# Patient Record
Sex: Female | Born: 1965 | Race: White | Hispanic: No | Smoking: Never smoker
Health system: Southern US, Community
[De-identification: ages and names within clinical notes are randomized; demographics above are authoritative.]

## PROBLEM LIST (undated history)

## (undated) DIAGNOSIS — Z9889 Other specified postprocedural states: Secondary | ICD-10-CM

## (undated) DIAGNOSIS — R011 Cardiac murmur, unspecified: Secondary | ICD-10-CM

## (undated) DIAGNOSIS — I10 Essential (primary) hypertension: Secondary | ICD-10-CM

## (undated) DIAGNOSIS — R112 Nausea with vomiting, unspecified: Secondary | ICD-10-CM

## (undated) DIAGNOSIS — A64 Unspecified sexually transmitted disease: Secondary | ICD-10-CM

## (undated) DIAGNOSIS — IMO0002 Reserved for concepts with insufficient information to code with codable children: Secondary | ICD-10-CM

## (undated) DIAGNOSIS — F32A Depression, unspecified: Secondary | ICD-10-CM

## (undated) DIAGNOSIS — K635 Polyp of colon: Secondary | ICD-10-CM

## (undated) DIAGNOSIS — N979 Female infertility, unspecified: Secondary | ICD-10-CM

## (undated) DIAGNOSIS — F419 Anxiety disorder, unspecified: Secondary | ICD-10-CM

## (undated) DIAGNOSIS — F329 Major depressive disorder, single episode, unspecified: Secondary | ICD-10-CM

## (undated) DIAGNOSIS — R87619 Unspecified abnormal cytological findings in specimens from cervix uteri: Secondary | ICD-10-CM

## (undated) DIAGNOSIS — J302 Other seasonal allergic rhinitis: Secondary | ICD-10-CM

## (undated) HISTORY — DX: Polyp of colon: K63.5

## (undated) HISTORY — DX: Other seasonal allergic rhinitis: J30.2

## (undated) HISTORY — DX: Depression, unspecified: F32.A

## (undated) HISTORY — DX: Anxiety disorder, unspecified: F41.9

## (undated) HISTORY — DX: Essential (primary) hypertension: I10

## (undated) HISTORY — PX: CRYOTHERAPY: SHX1416

## (undated) HISTORY — DX: Reserved for concepts with insufficient information to code with codable children: IMO0002

## (undated) HISTORY — DX: Unspecified sexually transmitted disease: A64

## (undated) HISTORY — PX: COLONOSCOPY: SHX174

## (undated) HISTORY — PX: COLPOSCOPY: SHX161

## (undated) HISTORY — DX: Cardiac murmur, unspecified: R01.1

## (undated) HISTORY — DX: Major depressive disorder, single episode, unspecified: F32.9

## (undated) HISTORY — DX: Unspecified abnormal cytological findings in specimens from cervix uteri: R87.619

## (undated) HISTORY — DX: Female infertility, unspecified: N97.9

## (undated) HISTORY — PX: DERMOID CYST  EXCISION: SHX1452

---

## 1990-02-02 HISTORY — PX: MANDIBLE SURGERY: SHX707

## 1998-05-15 ENCOUNTER — Other Ambulatory Visit: Admission: RE | Admit: 1998-05-15 | Discharge: 1998-05-15 | Payer: Self-pay | Admitting: *Deleted

## 1999-06-03 ENCOUNTER — Other Ambulatory Visit: Admission: RE | Admit: 1999-06-03 | Discharge: 1999-06-03 | Payer: Self-pay | Admitting: *Deleted

## 2001-03-08 ENCOUNTER — Other Ambulatory Visit: Admission: RE | Admit: 2001-03-08 | Discharge: 2001-03-08 | Payer: Self-pay | Admitting: *Deleted

## 2002-09-19 ENCOUNTER — Other Ambulatory Visit: Admission: RE | Admit: 2002-09-19 | Discharge: 2002-09-19 | Payer: Self-pay | Admitting: *Deleted

## 2003-11-26 ENCOUNTER — Other Ambulatory Visit: Admission: RE | Admit: 2003-11-26 | Discharge: 2003-11-26 | Payer: Self-pay | Admitting: *Deleted

## 2006-01-18 ENCOUNTER — Other Ambulatory Visit: Admission: RE | Admit: 2006-01-18 | Discharge: 2006-01-18 | Payer: Self-pay | Admitting: *Deleted

## 2006-01-22 ENCOUNTER — Ambulatory Visit: Payer: Self-pay | Admitting: Gastroenterology

## 2006-02-05 ENCOUNTER — Encounter (INDEPENDENT_AMBULATORY_CARE_PROVIDER_SITE_OTHER): Payer: Self-pay | Admitting: Specialist

## 2006-02-05 ENCOUNTER — Ambulatory Visit: Payer: Self-pay | Admitting: Gastroenterology

## 2007-04-25 ENCOUNTER — Other Ambulatory Visit: Admission: RE | Admit: 2007-04-25 | Discharge: 2007-04-25 | Payer: Self-pay | Admitting: *Deleted

## 2008-01-16 ENCOUNTER — Other Ambulatory Visit: Admission: RE | Admit: 2008-01-16 | Discharge: 2008-01-16 | Payer: Self-pay | Admitting: Obstetrics and Gynecology

## 2008-02-03 HISTORY — PX: ECTOPIC PREGNANCY SURGERY: SHX613

## 2008-09-06 ENCOUNTER — Ambulatory Visit (HOSPITAL_COMMUNITY): Admission: AD | Admit: 2008-09-06 | Discharge: 2008-09-06 | Payer: Self-pay | Admitting: Obstetrics and Gynecology

## 2008-09-09 ENCOUNTER — Inpatient Hospital Stay (HOSPITAL_COMMUNITY): Admission: AD | Admit: 2008-09-09 | Discharge: 2008-09-09 | Payer: Self-pay | Admitting: Obstetrics

## 2008-09-12 ENCOUNTER — Ambulatory Visit (HOSPITAL_COMMUNITY): Admission: RE | Admit: 2008-09-12 | Discharge: 2008-09-12 | Payer: Self-pay | Admitting: Obstetrics

## 2008-09-12 ENCOUNTER — Encounter (INDEPENDENT_AMBULATORY_CARE_PROVIDER_SITE_OTHER): Payer: Self-pay | Admitting: Obstetrics

## 2010-05-11 LAB — CBC
HCT: 34 % — ABNORMAL LOW (ref 36.0–46.0)
HCT: 36.5 % (ref 36.0–46.0)
Hemoglobin: 11.9 g/dL — ABNORMAL LOW (ref 12.0–15.0)
MCV: 90.1 fL (ref 78.0–100.0)
MCV: 91.8 fL (ref 78.0–100.0)
Platelets: 233 10*3/uL (ref 150–400)
RBC: 3.77 MIL/uL — ABNORMAL LOW (ref 3.87–5.11)
RDW: 12.3 % (ref 11.5–15.5)
WBC: 6 10*3/uL (ref 4.0–10.5)
WBC: 9.6 10*3/uL (ref 4.0–10.5)

## 2010-05-11 LAB — CREATININE, SERUM
GFR calc Af Amer: >60 mL/min (ref >60)
GFR calc non Af Amer: >60 mL/min (ref >60)

## 2010-05-11 LAB — TYPE AND SCREEN: Antibody Screen: NEGATIVE

## 2010-05-11 LAB — BASIC METABOLIC PANEL
Chloride: 107 mEq/L (ref 96–112)
GFR calc non Af Amer: >60 mL/min (ref >60)
Potassium: 4.2 mEq/L (ref 3.5–5.1)
Sodium: 138 mEq/L (ref 135–145)

## 2010-05-11 LAB — BUN: BUN: 11 mg/dL (ref 6–23)

## 2010-05-11 LAB — DIFFERENTIAL
Basophils Relative: 0 % (ref 0–1)
Eosinophils Absolute: 0.2 10*3/uL (ref 0.0–0.7)
Eosinophils Relative: 2 % (ref 0–5)
Lymphs Abs: 1.3 10*3/uL (ref 0.7–4.0)
Monocytes Relative: 7 % (ref 3–12)
Neutrophils Relative %: 78 % — ABNORMAL HIGH (ref 43–77)

## 2010-05-11 LAB — ABO/RH: ABO/RH(D): A POS

## 2010-06-17 NOTE — Op Note (Signed)
NAME:  Holly Hampton, Holly Hampton                 ACCOUNT NO.:  [id number]   MEDICAL RECORD NO.:  [id number]          PATIENT TYPE:  AMB   LOCATION:  SDC                           FACILITY:  [facility]   PHYSICIAN:  Lendon Colonel, MD   DATE OF BIRTH:  [birth year]   DATE OF PROCEDURE:  09/12/2008  DATE OF DISCHARGE:  09/12/2008                               OPERATIVE REPORT   PREOPERATIVE DIAGNOSES:  Live ectopic in the right fallopian tube,  complex left ovarian cyst.   POSTOPERATIVE DIAGNOSES:  Live ectopic in the right fallopian tube,  complex left ovarian cyst, left ovarian torsion, and bilateral tubal  adhesions.   PROCEDURES:  Bilateral tubal adhesiolysis, left partial oophorectomy,  pelvic washings, right partial salpingectomy, and removal of ectopic  pregnancy.   SURGEON:  Alphonsus Sias. Ernestina Penna, MD   ASSISTANT:  Darryl Nestle, MD   ANESTHESIA:  General.   FINDINGS:  Right ectopic pregnancy in the mid-isthmic portion of the  tube, right fallopian tube adhesed to the right ovary and the right  uterosacral ligaments, clubbed fimbriae on the right side, left enlarged  ovary with 6cm dermoid cyst, left ovarian torsion seen upon entry into  the pelvis, small amount of blood in the pelvis, left tube was adhesed  to small bowel and the left ovary, normal liver edge.   SPECIMEN:  Partial left ovary and dermoid cyst, pelvic washings, right  fallopian tube containing ectopic pregnancy, all to Pathology.   ANTIBIOTICS:  None.   ESTIMATED BLOOD LOSS:  100 mL.   COMPLICATIONS:  None.   INDICATIONS:  This is a 45 year old G1 with rising beta hCG and  development of an ectopic pregnancy with fetal heart beat 1 week after  receiving methotrexate.  The patient also with diagnosis of complex left  ovarian cyst.  Given the failure to methotrexate, the presence of the  fetal heart beat in the tube, the patient was consented for operative  management of her ectopic pregnancy.  A long discussion was held  regarding the different options for management of the complex cyst on  the left.   DESCRIPTION OF PROCEDURE:  After informed consent was obtained, the  patient was taken to the operating room where general anesthesia was  initialed without difficulty.  She was prepped and draped in normal  sterile fashion in dorsal supine lithotomy position.  A Foley catheter  was inserted sterilely into the bladder.  A bimanual examination was  done to assess the size and position of the uterus.  Adnexal exam was  deferred given the risk of rupture of the ectopic pregnancy.  Speculum  was inserted into the vagina.  Single-tooth tenaculum was used to grasp  the anterior lip of the cervix and the cervix was serially dilated to a  #6 [place].  A ZUMI uterine manipulator was inserted into the uterine  cavity.  The balloon was inflated.  Gloves were changed.  Attention was  turned to the patient's abdomen.  A 10-mm skin incision was made in the  infraumbilical fold after first infusing with 0.5% Marcaine.  Blunt  dissection  was taken down to the fascia.  The fascia was grasped with  Kocher clamps x2 and incised sharply.  Blunt dissection was done to the  posterior sheath and once in the peritoneal cavity.  A pursestring  suture of 0 Vicryl was placed along the fascial defect.  A Hasson 10-mm  trocar was advanced into the peritoneal cavity and pneumoperitoneum was  created with CO2 gas.  Brief survey of the abdomen and pelvis revealed  findings as above.  Special attention was paid to the left ovary, which  was found to be torsed on the enlongated utero-ovarian ligament. The  ovarian tissue appeared healthy w/o necrosis and it was thought the  ovary recently torsed or was having intermittent torsion due to the  large cyst.  This was not known preoperatively.  The patient was placed  in Trendelenburg position.  A 5-mm skin incisions were made in the right  and left lower quadrant after first infusing with 0.5%  Marcaine and  nonbladed trocar insertion was done under direct visualization.  Blunt  and grasping instruments were placed through the lower quadrant  incisions to aid in the visualization.  The left ovary was detorsed. The  right ectopic was first evaluated, given the degree of hyperemia and  blood flow to the area as well as noting the clubbed fimbriae on the  right tube, the decision was made to proceed with a right salpingectomy.  Of note, the ectopic pregnancy was located within the tube, quite close  to the infundibulopelvic ligament.  Sharp dissection with hot monopolar  scissors were done between the right tube and the right ovary and also  between the right tube and the right uterosacral ligaments.  No evidence  of endometriosis was found throughout the case.  Using the gyrus  tripolar device, the right fallopian tube was serially cauterized and  transected across.  The mesosalpinx was also dissected under with the  gyrus device and the distal right fallopian tube was freed and placed in  the posterior cul-de-sac.  Excellent hemostasis was noted.  A small  right corpus luteum cyst was noted on the right ovary, this was no  manipulated.  The remainder of the right ovary was thoroughly examined.  No evidence of a dermoid cyst on the right was done.  Adhesions were  clear of the right ovary.  Attention was turned to the patient's left  side.  Close examination of the large cystic left ovary was done.  It  appeared that the large cystic portion of the ovary was at the distal  end of the left ovary while the proximal portion of the ovary remained  healthy.  Pelvic washing was obtained upon entry into the pelvis and the  left ovary was immediately detorsed upon entering the pelvis.  The  ovarian tissue looked healthy.  The decision, given the large cystic  portion of the ovary and given the high recurrence risk for torsion with  possible permanent damage and loss to the left ovary.  The  decision was  made to proceed with left ovarian cystectomy, although initial attempts  were made to keep the left cyst intact, even on gentle manipulation of  the left ovary spillage of fatty material was noted.  This was quickly  irrigated with the Nezhat suction irrigator.  Shortly thereafter, hair  could be seen, coming out of the ostomy from the left cyst.  At this  point, a dermoid cyst was diagnosed.  Hot monopolar scissors were used  to cut across the distal aspect of the left ovarian tissue right at the  level of the dermoid cyst and combination of monopolar scissors and  gyrus tripolar was used.  Once completely dissected free from the left  ovary, 5-mm camera was placed in the right lower quadrant and Endocatch  bag was placed in the umbilical incision and the left dermoid cyst and  the right distal fallopian tube with ectopic pregnancy replaced into the  Endocatch bag and removed the Hasson.  Port had to be removed to allow  removal of the cyst and ectopic pregnancy.  The Endocatch bag did not  fit through the fascial defect and the fascial incision had to be  extended with the Mayo scissors.  A small tear at this point had in the  Endocatch bag with some small leak in the fatty fluid from the dermoid  cyst.  This was suction irrigated as the cyst was removed.  The  Endocatch bag was removed from the patient's abdomen and trocars were  reinserted.  Copious irrigation about 2 L in total for the case were  done with the Nezhat suction irrigator.  The left ovary was reinspected.  The left ovarian tissue looked very healthy, was hemostatic.  A very  small amount of cyst wall remained and this was cauterized with the  monopolar scissors.  Attention was then turned to the left tube.  This  was noted to be adherent to the omentum and bowel.  These adhesions were  taken down sharply.  Once spot of adhesions were taken down with  monopolar scissors.  The rest were taken down sharply  without any  electrocautery.  At this point, everything was reinspected, found to be  hemostatic.  Pictures were obtained and the procedure was terminated.  All instruments were removed.  The umbilical incision was closed with 0  Vicryl in a UR needle.  The skin incision was closed with 4-0 Vicryl in  a running fashion and Dermabond was placed over the umbilical incisions  and also on the right and left lower  quadrant incisions.  The uterine manipulator was removed.  Excellent  hemostasis was noted.  The patient was awoken from general anesthesia  and having tolerated the procedure well.  Sponge, lap, and needle counts  were correct x3.  The patient was taken to the recovery room in stable  condition.      Lendon Colonel, MD  Electronically Signed     KAF/MEDQ  D:  09/12/2008  T:  09/13/2008  Job:  [id number]

## 2010-06-20 NOTE — Letter (Signed)
January 22, 2006    Almedia Balls. Fore, M.D.  [phone] [address]. [address], [address]  [place],  [place] [address]   RE:  Holly Hampton, Holly Hampton  MRN:  [id number]  /  DOB:  07/26/1965   Dear Dr. Randell Patient,   Mrs. 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 Randell Patient for evaluation of  colonoscopy.   Iyah has been in good health for all of her life except for chronic  allergic sinusitis, and in the past has had allergy testing and shots  per Dr. Stevphen Rochester.  She really otherwise is free of any general  medical problems.  She has had some increased mucus in her stools but  has regular bowel movements without lower abdominal or rectal pain.  She  denies upper GI or hepatobiliary problems.  She has never had to have  colonoscopy or barium enema exams.  She is concerned because her mother  has had recurrent colon polyps beginning in her early 88's.  She does  not know her father's history since he died at age 29.   She follows a regular diet, denies specific food intolerances, has had  no anorexia or weight loss.   PAST MEDICAL HISTORY:  Otherwise noncontributory.   MEDICATIONS:  Claritin D regularly.   ALLERGIES:  She denies drug allergies.   FAMILY HISTORY:  Multiple family members, all males, have had early  cardiovascular disease and early death.  The patient does have a sister  who has had gallstones in her 63's.  She also has several aunts and  uncles who have had lung carcinoma.  There are no known gynecological or  breast malignancies in her family.   SOCIAL HISTORY:  She is married and lives with her husband.  She has a  bachelor's degree and works as a Network engineer.  She does not  smoke or abuse ethanol.   REVIEW OF SYSTEMS:  Noncontributory including any gynecologic problems.  Her last menstrual period was January 21, 2006.   PHYSICAL EXAMINATION:  She is an attractive, healthy appearing white  female in no distress.  She is  5 feet 8 inches tall and weighs 155  pounds.  Blood pressure is 124/68 and pulse was 80 and regular.  I could  not appreciate stigmata of chronic liver disease.  CHEST:  Entirely clear.  HEART:  Regular rhythm without murmurs, gallops or rubs.  ABDOMEN:  There was no hepatosplenomegaly, abdominal masses or  tenderness.  Bowel sounds were normal.  EXTREMITIES:  Unremarkable.  NEURO:  Mental status was clear.  RECTAL:  Deferred.   ASSESSMENT:  1. Need for screening colonoscopy because of her mother's history of      colon polyps, certainly does not sound like she has hereditary      colon polyposis syndrome in her family.  2. History of multiple allergies with previous allergy shots.  3. Family history of early cardiovascular disease in female members.  4. Family history of multiple relatives with lung carcinoma.  5. History of cholelithiasis in her sister.   RECOMMENDATIONS:  I have talked with Mekala extensively about colonoscopy,  risks and benefits.  She agrees to undergo screening colonoscopy and we  will proceed accordingly.  At her request, we will use the Osmo-Prep  which is a pill-based prep since she does not want to drink an osmotic  solution.  She is to be off of salicylates and NSAIDs a week before  this  procedure.  If the patient does have multiple colon polyps we will refer  her to our genetic counselor for further evaluation.    Sincerely,      Vania Rea. Jarold Motto, MD, [facility], FAGA  Electronically Signed    DRP/MedQ  DD: 01/22/2006  DT: 01/22/2006  Job #: [id number]

## 2011-04-01 ENCOUNTER — Encounter: Payer: Self-pay | Admitting: Gastroenterology

## 2011-11-06 ENCOUNTER — Encounter: Payer: Self-pay | Admitting: Gastroenterology

## 2012-07-06 ENCOUNTER — Encounter: Payer: Self-pay | Admitting: Gastroenterology

## 2012-08-19 ENCOUNTER — Ambulatory Visit (AMBULATORY_SURGERY_CENTER): Payer: 59 | Admitting: *Deleted

## 2012-08-19 ENCOUNTER — Encounter: Payer: Self-pay | Admitting: Gastroenterology

## 2012-08-19 VITALS — Ht 68.0 in | Wt 150.2 lb

## 2012-08-19 DIAGNOSIS — Z86010 Personal history of colonic polyps: Secondary | ICD-10-CM

## 2012-08-19 DIAGNOSIS — J302 Other seasonal allergic rhinitis: Secondary | ICD-10-CM

## 2012-08-19 HISTORY — DX: Other seasonal allergic rhinitis: J30.2

## 2012-08-19 MED ORDER — MOVIPREP 100 G PO SOLR: 1.0000 | Freq: Once | ORAL | Status: DC

## 2012-08-19 MED ORDER — NA SULFATE-K SULFATE-MG SULF 17.5-3.13-1.6 GM/177ML PO SOLN: 1.0000 | Freq: Once | ORAL | Status: DC

## 2012-08-19 NOTE — Progress Notes (Signed)
Denies allergies to eggs or soy products. Denies complications with sedation or anesthesia. 

## 2012-09-02 ENCOUNTER — Ambulatory Visit (AMBULATORY_SURGERY_CENTER): Payer: 59 | Admitting: Gastroenterology

## 2012-09-02 ENCOUNTER — Encounter: Payer: Self-pay | Admitting: Gastroenterology

## 2012-09-02 VITALS — BP 139/74 | HR 62 | Temp 97.5°F | Resp 21 | Ht 68.0 in | Wt 150.0 lb

## 2012-09-02 DIAGNOSIS — D126 Benign neoplasm of colon, unspecified: Secondary | ICD-10-CM

## 2012-09-02 DIAGNOSIS — Z86010 Personal history of colonic polyps: Secondary | ICD-10-CM

## 2012-09-02 MED ORDER — SODIUM CHLORIDE 0.9 % IV SOLN: 500.0000 mL | INTRAVENOUS | Status: DC

## 2012-09-02 NOTE — Progress Notes (Signed)
Called to room to assist during endoscopic procedure.  Patient ID and intended procedure confirmed with present staff. Received instructions for my participation in the procedure from the performing physician.  

## 2012-09-02 NOTE — Patient Instructions (Addendum)
YOU HAD AN ENDOSCOPIC PROCEDURE TODAY AT THE [facility]: Refer to the procedure report that was given to you for any specific questions about what was found during the examination.  If the procedure report does not answer your questions, please call your gastroenterologist to clarify.  If you requested that your care partner not be given the details of your procedure findings, then the procedure report has been included in a sealed envelope for you to review at your convenience later.  YOU SHOULD EXPECT: Some feelings of bloating in the abdomen. Passage of more gas than usual.  Walking can help get rid of the air that was put into your GI tract during the procedure and reduce the bloating. If you had a lower endoscopy (such as a colonoscopy or flexible sigmoidoscopy) you may notice spotting of blood in your stool or on the toilet paper. If you underwent a bowel prep for your procedure, then you may not have a normal bowel movement for a few days.  DIET: Your first meal following the procedure should be a light meal and then it is ok to progress to your normal diet.  A half-sandwich or bowl of soup is an example of a good first meal.  Heavy or fried foods are harder to digest and may make you feel nauseous or bloated.  Likewise meals heavy in dairy and vegetables can cause extra gas to form and this can also increase the bloating.  Drink plenty of fluids but you should avoid alcoholic beverages for 24 hours.  ACTIVITY: Your care partner should take you home directly after the procedure.  You should plan to take it easy, moving slowly for the rest of the day.  You can resume normal activity the day after the procedure however you should NOT DRIVE or use heavy machinery for 24 hours (because of the sedation medicines used during the test).    SYMPTOMS TO REPORT IMMEDIATELY: A gastroenterologist can be reached at any hour.  During normal business hours, 8:30 AM to 5:00 PM Monday through Friday,  call [phone].  After hours and on weekends, please call the GI answering service at [phone] who will take a message and have the physician on call contact you.   Following lower endoscopy (colonoscopy or flexible sigmoidoscopy):  Excessive amounts of blood in the stool  Significant tenderness or worsening of abdominal pains  Swelling of the abdomen that is new, acute  Fever of 100F or higher  FOLLOW UP: If any biopsies were taken you will be contacted by phone or by letter within the next 1-3 weeks.  Call your gastroenterologist if you have not heard about the biopsies in 3 weeks.  Our staff will call the home number listed on your records the next business day following your procedure to check on you and address any questions or concerns that you may have at that time regarding the information given to you following your procedure. This is a courtesy call and so if there is no answer at the home number and we have not heard from you through the emergency physician on call, we will assume that you have returned to your regular daily activities without incident.  SIGNATURES/CONFIDENTIALITY: You and/or your care partner have signed paperwork which will be entered into your electronic medical record.  These signatures attest to the fact that that the information above on your After Visit Summary has been reviewed and is understood.  Full responsibility of the confidentiality of this   discharge information lies with you and/or your care-partner.  Recommendations Await pathology results Repeat colonoscopy in 5 years  

## 2012-09-02 NOTE — Progress Notes (Signed)
Patient denies any allergies to eggs or soy. Patient denies any problems with anesthesia. Gets nauseated after anesthesia. Denies any problems post colonoscopy last time.

## 2012-09-02 NOTE — Op Note (Signed)
[facility] [address] [place], [address]   COLONOSCOPY PROCEDURE REPORT  PATIENT: Holly Hampton, Holly Hampton  MR#: [id number] BIRTHDATE: 09-Nov-1965 , 46  yrs. old GENDER: Female ENDOSCOPIST: Mardella Layman, MD, [facility] REFERRED BY: PROCEDURE DATE:  09/02/2012 PROCEDURE:   Colonoscopy with snare polypectomy First Screening Colonoscopy - Avg.  risk and is 50 yrs.  old or older - No.  Prior Negative Screening - Now for repeat screening. N/A  History of Adenoma - Now for follow-up colonoscopy & has been > or = to 3 yrs.  Polyps Removed Today? Yes. ASA CLASS:   Class II INDICATIONS:Patient's family history of colon polyps and Patient's personal history of adenomatous colon polyps. MEDICATIONS: propofol (Diprivan) 300mg  IV  DESCRIPTION OF PROCEDURE:   After the risks benefits and alternatives of the procedure were thoroughly explained, informed consent was obtained.  A digital rectal exam revealed no abnormalities of the rectum.   The LB [id number]  endoscope was introduced through the anus and advanced to the cecum, which was identified by both the appendix and ileocecal valve. No adverse events experienced.   The quality of the prep was excellent, using MoviPrep  The instrument was then slowly withdrawn as the colon was fully examined.      COLON FINDINGS: Two polypoid shaped sessile polyps ranging between 5-60mm in size were found in the sigmoid colon.  A polypectomy was performed using snare cautery.  The resection was complete and the polyp tissue was completely retrieved.  Retroflexed views revealed no abnormalities. The time to cecum=6 minutes 50 seconds. Withdrawal time=11 minutes 28 seconds.  The scope was withdrawn and the procedure completed. COMPLICATIONS: There were no complications.  ENDOSCOPIC IMPRESSION: Two sessile polyps ranging between 5-32mm in size were found in the sigmoid colon; polypectomy was performed using snare  cautery  RECOMMENDATIONS: 1.  Await pathology results 2.  Repeat Colonoscopy in 5 years.   eSigned:  Mardella Layman, MD, [facility] 09/02/2012 9:52 AM   cc:

## 2012-09-02 NOTE — Progress Notes (Signed)
Patient did not have preoperative order for IV antibiotic SSI prophylaxis. (G8918)  Patient did not experience any of the following events: a burn prior to discharge; a fall within the facility; wrong site/side/patient/procedure/implant event; or a hospital transfer or hospital admission upon discharge from the facility. (G8907)  

## 2012-09-05 ENCOUNTER — Telehealth: Payer: Self-pay | Admitting: *Deleted

## 2012-09-05 NOTE — Telephone Encounter (Signed)
  Follow up Call-  Call back number 09/02/2012  Post procedure Call Back phone  # [phone]  Permission to leave phone message Yes     No answer at number given.  Left message on voicemail.

## 2012-09-06 ENCOUNTER — Encounter: Payer: Self-pay | Admitting: Gastroenterology

## 2012-11-22 ENCOUNTER — Encounter: Payer: Self-pay | Admitting: Certified Nurse Midwife

## 2012-11-23 ENCOUNTER — Encounter: Payer: Self-pay | Admitting: Certified Nurse Midwife

## 2012-11-23 ENCOUNTER — Ambulatory Visit (INDEPENDENT_AMBULATORY_CARE_PROVIDER_SITE_OTHER): Payer: 59 | Admitting: Certified Nurse Midwife

## 2012-11-23 VITALS — BP 118/68 | HR 72 | Resp 16 | Ht 67.75 in | Wt 147.0 lb

## 2012-11-23 DIAGNOSIS — Z01419 Encounter for gynecological examination (general) (routine) without abnormal findings: Secondary | ICD-10-CM

## 2012-11-23 DIAGNOSIS — Z0000 Encounter for general adult medical examination without abnormal findings: Secondary | ICD-10-CM

## 2012-11-23 DIAGNOSIS — B373 Candidiasis of vulva and vagina: Secondary | ICD-10-CM

## 2012-11-23 LAB — POCT URINALYSIS DIPSTICK
Glucose, UA: NEGATIVE
Leukocytes, UA: NEGATIVE
Protein, UA: NEGATIVE
Urobilinogen, UA: NEGATIVE

## 2012-11-23 LAB — COMPREHENSIVE METABOLIC PANEL
ALT: 16 U/L (ref 0–35)
AST: 21 U/L (ref 0–37)
CO2: 31 mEq/L (ref 19–32)
Calcium: 9.4 mg/dL (ref 8.4–10.5)
Chloride: 105 mEq/L (ref 96–112)
Creat: 0.81 mg/dL (ref 0.50–1.10)
Sodium: 142 mEq/L (ref 135–145)
Total Bilirubin: 0.3 mg/dL (ref 0.3–1.2)
Total Protein: 6.9 g/dL (ref 6.0–8.3)

## 2012-11-23 LAB — LIPID PANEL
HDL: 43 mg/dL (ref >39)
LDL Cholesterol: 75 mg/dL (ref 0–99)

## 2012-11-23 MED ORDER — NYSTATIN-TRIAMCINOLONE 100000-0.1 UNIT/GM-% EX OINT: TOPICAL_OINTMENT | Freq: Two times a day (BID) | CUTANEOUS | Status: DC

## 2012-11-23 NOTE — Patient Instructions (Signed)

## 2012-11-23 NOTE — Progress Notes (Signed)
47 y.o. G80P0010 Married Caucasian Fe here for annual exam. Periods normal, no issues.Stress at work has increasedwith resulting fatigue. Spouse diagnosed with Parkinsons's Disease(age 64). Patient running half marathon in 4 weeks with spouse! Patient complaining of external vulva irritation off and on for several weeks. No new personal products, but "sweats a lot with running". No other health issues.  Patient's last menstrual period was 11/17/2012.          Sexually active: yes  The current method of family planning is none.    Exercising: yes  walking, running,weights training Smoker:  no  Health Maintenance: Pap:  08-17-11 neg HPV HR neg MMG:  2013 Colonoscopy:  09/02/12 BMD:   none TDaP: unsure per patient feels up to date. Labs: Poct urine-neg, Hgb-12.1 Self breast exam: done occ   reports that she has never smoked. She has never used smokeless tobacco. She reports that she drinks alcohol. She reports that she does not use illicit drugs.  Past Medical History  Diagnosis Date  . Seasonal allergies   . Heart murmur   . Abnormal Pap smear     +HPV  . STD (sexually transmitted disease)     HSV1  . Depression   . Infertility, female     Past Surgical History  Procedure Laterality Date  . Colonoscopy  2008, 2014    polyps removed  . Mandible surgery  1992  . Ectopic pregnancy surgery  2010  . Colposcopy    . Cryotherapy    . Ectopic pregnancy surgery      right tube removal  . Dermoid cyst  excision      6cm    Current Outpatient Prescriptions  Medication Sig Dispense Refill  . ACYCLOVIR PO Take by mouth as needed.      . B Complex Vitamins (B COMPLEX PO) Take 1 tablet by mouth daily.      Marland Kitchen BIOTIN PO Take by mouth daily.      . cetirizine (ZYRTEC) 10 MG tablet Take 10 mg by mouth daily.      . Cholecalciferol (VITAMIN D PO) Take by mouth daily.      . Coenzyme Q10 (CO Q 10 PO) Take 1 tablet by mouth daily.      . Flaxseed, Linseed, (FLAXSEED OIL PO) Take by mouth.  occ      . Multiple Vitamins-Minerals (MULTIVITAMIN PO) Take 1 tablet by mouth daily.      Marland Kitchen tretinoin (RETIN-A) 0.05 % cream Apply 1 application topically 3 (three) times a week.      . nystatin-triamcinolone ointment (MYCOLOG) Apply topically 2 (two) times daily. For up to 7 days  30 g  1   No current facility-administered medications for this visit.    Family History  Problem Relation Age of Onset  . Colon polyps Mother   . Colon cancer Neg Hx   . Esophageal cancer Neg Hx   . Rectal cancer Neg Hx   . Stomach cancer Neg Hx   . Heart disease Father   . Heart disease Sister     heart stint  . Stroke Maternal Grandmother   . Hypertension Maternal Grandmother     ROS:  Pertinent items are noted in HPI.  Otherwise, a comprehensive ROS was negative.  Exam:   BP 118/68  Pulse 72  Resp 16  Ht 5' 7.75" (1.721 m)  Wt 147 lb (66.679 kg)  BMI 22.51 kg/m2  LMP 11/17/2012 Height: 5' 7.75" (172.1 cm)  Ht Readings from  Last 3 Encounters:  11/23/12 5' 7.75" (1.721 m)  09/02/12 5\' 8"  (1.727 m)  08/19/12 5\' 8"  (1.727 m)    General appearance: alert, cooperative and appears stated age Head: Normocephalic, without obvious abnormality, atraumatic Neck: no adenopathy, supple, symmetrical, trachea midline and thyroid normal to inspection and palpation Lungs: clear to auscultation bilaterally Breasts: normal appearance, no masses or tenderness, No nipple retraction or dimpling, No nipple discharge or bleeding, No axillary or supraclavicular adenopathy Heart: regular rate and rhythm Abdomen: soft, non-tender; no masses,  no organomegaly Extremities: extremities normal, atraumatic, no cyanosis or edema Skin: Skin color, texture, turgor normal. No rashes or lesions, bra rub noted at bra band line( due to running per patient)  Lymph nodes: Cervical, supraclavicular, and axillary nodes normal. No abnormal inguinal nodes palpated Neurologic: Grossly normal   Pelvic: External genitalia:  no  lesions, increased pink with exudate, wet prep taken              Urethra:  normal appearing urethra with no masses, tenderness or lesions              Bartholin's and Skene's: normal                 Vagina: normal appearing vagina with normal color and discharge, no lesions ph 4.0 wet prep taken              Cervix: normal non tender              Pap taken: yes Bimanual Exam:  Uterus:  normal size, contour, position, consistency, mobility, non-tender and mid position              Adnexa: normal adnexa and no mass, fullness, tenderness               Rectovaginal: Confirms               Anus:  normal sphincter tone, no lesions  Wet Prep: positive for vulva yeast only  A:  Well Woman with normal exam  Contraception none desired, history of infertility  Yeast vulvitis  Social stress with spouse and work    P:   Reviewed health and wellness pertinent to exam  Discussed findings and need to change out of wet work out clothes when possible to prevent  Rx Mycolog see order  Labs:TSH,Lipid panel,CMP,Vit.D  Pap smear as per guidelines   Mammogram yearly stressed pap smear  Taken today with reflex  counseled on breast self exam, adequate intake of calcium and vitamin D, diet and exercise  return annually or prn  An After Visit Summary was printed and given to the patient.

## 2012-11-24 LAB — VITAMIN D 25 HYDROXY (VIT D DEFICIENCY, FRACTURES): Vit D, 25-Hydroxy: 58 ng/mL (ref 30–89)

## 2012-11-24 LAB — HEMOGLOBIN, FINGERSTICK: Hemoglobin, fingerstick: 12.1 g/dL (ref 12.0–16.0)

## 2012-11-25 LAB — IPS PAP TEST WITH REFLEX TO HPV

## 2012-11-27 NOTE — Progress Notes (Signed)
Note reviewed, agree with plan.  Reyanne Hussar, MD  

## 2013-01-02 ENCOUNTER — Telehealth: Payer: Self-pay | Admitting: Certified Nurse Midwife

## 2013-01-02 NOTE — Telephone Encounter (Signed)
Lab Results  Component Value Date   TSH 1.822 11/23/2012

## 2013-01-02 NOTE — Telephone Encounter (Signed)
Patient advised of normal TSH. States she had concerns regarding hair thinning and that is why TSH was tested.   Routing to provider for final review. Patient agreeable to disposition. Will close encounter

## 2013-01-02 NOTE — Telephone Encounter (Signed)
Patient states she spoke with joy in regards to results from blood work. She realized she didn't say anything about her thyroid and wanted to know if we tested it if we did what were the results.

## 2013-01-02 NOTE — Telephone Encounter (Signed)
Patient states she spoke with joy on results from blood testing but realized that she didn't say anything about her thyroid she didn't know if we tested it if we did what were the results.

## 2013-04-17 ENCOUNTER — Telehealth: Payer: Self-pay | Admitting: Certified Nurse Midwife

## 2013-04-17 NOTE — Telephone Encounter (Signed)
Spoke with patient. She is requesting to go on some form of birth control for cycle regulation. She is still having monthly periods but they are becoming "more and more irregular", lasting longer in duration and unpredictable dates and patient would like to have more cycle control. She has done some research and is interested in a pill where she would not have a cycle at all and specifically mentions Seasonale.  Patient had AEX in 11/2012.  I advised I would send her request to Regina Eck CNM and give her a call back with instructions.   Patient okay with detailed message on work or cell phone to return call.

## 2013-04-17 NOTE — Telephone Encounter (Signed)
Pt wants to talk with the nurse about her medication.

## 2013-04-18 ENCOUNTER — Other Ambulatory Visit: Payer: Self-pay | Admitting: Certified Nurse Midwife

## 2013-04-18 DIAGNOSIS — Z309 Encounter for contraceptive management, unspecified: Secondary | ICD-10-CM

## 2013-04-18 MED ORDER — NORETHIN ACE-ETH ESTRAD-FE 1-20 MG-MCG(24) PO TABS: 1.0000 | ORAL_TABLET | Freq: Every day | ORAL | Status: DC

## 2013-04-18 NOTE — Telephone Encounter (Signed)
Ok to start Lomedia 24. Will need 2 month check after starting on with next period. Order placed.

## 2013-04-18 NOTE — Telephone Encounter (Signed)
Spoke with patient. Instructions given. To start lomedia on first day of her cycle. To call back for office visit in two months for follow up.   Routing to provider for final review. Patient agreeable to disposition. Will close encounter

## 2013-04-18 NOTE — Telephone Encounter (Signed)
Patient is calling to see what the plan is for the birthcontrol said she started her cycle and one of the pills they were talking about says to start within 24 hrs of starting cycle.

## 2013-06-19 ENCOUNTER — Other Ambulatory Visit: Payer: Self-pay | Admitting: Certified Nurse Midwife

## 2013-06-19 ENCOUNTER — Telehealth: Payer: Self-pay | Admitting: Certified Nurse Midwife

## 2013-06-19 DIAGNOSIS — Z309 Encounter for contraceptive management, unspecified: Secondary | ICD-10-CM

## 2013-06-19 MED ORDER — NORETHIN ACE-ETH ESTRAD-FE 1-20 MG-MCG(24) PO TABS: 1.0000 | ORAL_TABLET | Freq: Every day | ORAL | Status: DC

## 2013-06-19 NOTE — Telephone Encounter (Signed)
Patient calling requesting a new RX for brand name Lo Loestrin FE instead so she can use the discount card she has.  [place]

## 2013-06-19 NOTE — Telephone Encounter (Signed)
Regina Eck CNM, order pending your approval for brand Loestrin 24 Fe 1-20 #1 with 7RF.

## 2013-06-19 NOTE — Telephone Encounter (Signed)
My previous note said to have her come in after 2 months of use to see how cycles are and check BP since starting on OCP. I am willing to renew Rx for one more month but will need ov as above. Order in as requested

## 2013-06-19 NOTE — Telephone Encounter (Signed)
Left message to call Kaitlyn at [phone]. 

## 2013-06-20 NOTE — Telephone Encounter (Signed)
Patient returning Kaitlyn's call. °

## 2013-06-21 NOTE — Telephone Encounter (Signed)
Spoke with patient. Advised brand only Loestrin sent over to pharmacy of choice. Advised will need office visit with Regina Eck CNM for follow up on new OCP. Patient agreeable. Appointment scheduled for 6/16 at 9:15am with Regina Eck CNM. Patient agreeable to date and time.  Routing to provider for final review. Patient agreeable to disposition. Will close encounter

## 2013-07-14 ENCOUNTER — Telehealth: Payer: Self-pay | Admitting: Certified Nurse Midwife

## 2013-07-14 NOTE — Telephone Encounter (Signed)
Pt canceled appointment for follow up for new OCP pt states she will call back to reschedule appointment.

## 2013-07-18 ENCOUNTER — Ambulatory Visit: Payer: 59 | Admitting: Certified Nurse Midwife

## 2013-11-13 ENCOUNTER — Telehealth: Payer: Self-pay | Admitting: Certified Nurse Midwife

## 2013-11-13 NOTE — Telephone Encounter (Signed)
Spoke with patient at time of incoming call. Patient states that she has been having vaginal itching, burning, and discharge for around 3 weeks. Patient has used OTC monistat 2 weeks ago which provided relief but did not get rid of symptoms. Patient has also been having bleeding after intercourse. Patient would like to schedule an appointment for tomorrow to see Regina Eck CNM. Appointment scheduled for 10/13 at 10:30am with Regina Eck CNM. Patient agreeable to date and time.  Routing to provider for final review. Patient agreeable to disposition. Will close encounter

## 2013-11-13 NOTE — Telephone Encounter (Signed)
Pt has been having itching, burning, and some bleeding for about 3 weeks now. Pt used monastait 2 weeks ago but has had no releif. Pt hasnt had any bleeding in the last 10 days but did have it before then.

## 2013-11-14 ENCOUNTER — Encounter: Payer: Self-pay | Admitting: Certified Nurse Midwife

## 2013-11-14 ENCOUNTER — Ambulatory Visit (INDEPENDENT_AMBULATORY_CARE_PROVIDER_SITE_OTHER): Payer: 59 | Admitting: Certified Nurse Midwife

## 2013-11-14 VITALS — BP 120/70 | HR 72 | Resp 16 | Ht 67.75 in | Wt 153.0 lb

## 2013-11-14 DIAGNOSIS — B3731 Acute candidiasis of vulva and vagina: Secondary | ICD-10-CM

## 2013-11-14 DIAGNOSIS — A499 Bacterial infection, unspecified: Secondary | ICD-10-CM

## 2013-11-14 DIAGNOSIS — B9689 Other specified bacterial agents as the cause of diseases classified elsewhere: Secondary | ICD-10-CM

## 2013-11-14 DIAGNOSIS — B373 Candidiasis of vulva and vagina: Secondary | ICD-10-CM

## 2013-11-14 DIAGNOSIS — Z113 Encounter for screening for infections with a predominantly sexual mode of transmission: Secondary | ICD-10-CM

## 2013-11-14 DIAGNOSIS — N760 Acute vaginitis: Secondary | ICD-10-CM

## 2013-11-14 MED ORDER — FLUCONAZOLE 150 MG PO TABS: ORAL_TABLET | ORAL | Status: DC

## 2013-11-14 MED ORDER — METRONIDAZOLE 0.75 % VA GEL: 1.0000 | Freq: Two times a day (BID) | VAGINAL | Status: DC

## 2013-11-14 NOTE — Patient Instructions (Signed)
Yeast Infection of the Skin Some yeast on the skin is normal, but sometimes it causes an infection. If you have a yeast infection, it shows up as white or light brown patches on brown skin. You can see it better in the summer on tan skin. It causes light-colored holes in your suntan. It can happen on any area of the body. This cannot be passed from person to person. HOME CARE  Scrub your skin daily with a dandruff shampoo. Your rash may take a couple weeks to get well.  Do not scratch or itch the rash. GET HELP RIGHT AWAY IF:   You get another infection from scratching. The skin may get warm, red, and may ooze fluid.  The infection does not seem to be getting better. MAKE SURE YOU:  Understand these instructions.  Will watch your condition.  Will get help right away if you are not doing well or get worse. Document Released: 01/02/2008 Document Revised: 04/13/2011 Document Reviewed: 01/02/2008 [facility] Patient Information 2015 [place], [place]. This information is not intended to replace advice given to you by your health care provider. Make sure you discuss any questions you have with your health care provider. Bacterial Vaginosis Bacterial vaginosis is a vaginal infection that occurs when the normal balance of bacteria in the vagina is disrupted. It results from an overgrowth of certain bacteria. This is the most common vaginal infection in women of childbearing age. Treatment is important to prevent complications, especially in pregnant women, as it can cause a premature delivery. CAUSES  Bacterial vaginosis is caused by an increase in harmful bacteria that are normally present in smaller amounts in the vagina. Several different kinds of bacteria can cause bacterial vaginosis. However, the reason that the condition develops is not fully understood. RISK FACTORS Certain activities or behaviors can put you at an increased risk of developing bacterial vaginosis, including:  Having a new sex  partner or multiple sex partners.  Douching.  Using an intrauterine device (IUD) for contraception. Women do not get bacterial vaginosis from toilet seats, bedding, swimming pools, or contact with objects around them. SIGNS AND SYMPTOMS  Some women with bacterial vaginosis have no signs or symptoms. Common symptoms include:  Grey vaginal discharge.  A fishlike odor with discharge, especially after sexual intercourse.  Itching or burning of the vagina and vulva.  Burning or pain with urination. DIAGNOSIS  Your health care provider will take a medical history and examine the vagina for signs of bacterial vaginosis. A sample of vaginal fluid may be taken. Your health care provider will look at this sample under a microscope to check for bacteria and abnormal cells. A vaginal pH test may also be done.  TREATMENT  Bacterial vaginosis may be treated with antibiotic medicines. These may be given in the form of a pill or a vaginal cream. A second round of antibiotics may be prescribed if the condition comes back after treatment.  HOME CARE INSTRUCTIONS   Only take over-the-counter or prescription medicines as directed by your health care provider.  If antibiotic medicine was prescribed, take it as directed. Make sure you finish it even if you start to feel better.  Do not have sex until treatment is completed.  Tell all sexual partners that you have a vaginal infection. They should see their health care provider and be treated if they have problems, such as a mild rash or itching.  Practice safe sex by using condoms and only having one sex partner. [place]  IF:   Your symptoms are not improving after 3 days of treatment.  You have increased discharge or pain.  You have a fever. MAKE SURE YOU:   Understand these instructions.  Will watch your condition.  Will get help right away if you are not doing well or get worse. FOR MORE INFORMATION  Centers for Disease Control and  Prevention, Division of STD Prevention: [employer] American Sexual Health Association (ASHA): www.ashastd.org  Document Released: 01/19/2005 Document Revised: 11/09/2012 Document Reviewed: 08/31/2012 [facility] Patient Information 2015 [place], [place]. This information is not intended to replace advice given to you by your health care provider. Make sure you discuss any questions you have with your health care provider.

## 2013-11-14 NOTE — Progress Notes (Signed)
48 yo separated white female g1p0010here with complaint of vaginal symptoms of itching, burning, and increase discharge. Describes discharge as odorous and watery. Onset of symptoms 3 weeks  ago. Denies new personal products or vaginal dryness. Patient and spouse are separated but are still sexual activity and also another partner. Denies history of STD except HSV.   STD concerns, desires screening. Urinary symptoms none. No thong use. Patient had leftover Mycolog cream she has been applying with little relief. Contraception OCP   O:Healthy female WDWN Affect: normal, orientation x 3  Exam: Abdomen:soft Lymph node: no enlargement or tenderness Pelvic exam: External genital: red, shiny in appearance with scant exudate, wet prep taken BUS: negative Vagina: watery grey odorous discharge noted. Ph:5.0   ,Wet prep taken Cervix: normal, non tender Uterus: normal, non tender Adnexa:normal, non tender, no masses or fullness noted   Wet Prep results: positive for clue, yeast both vaginal/external   A:BV Yeast vaginitis STD screening   P:Discussed findings of BV,Yeast vaginitis/vulvitis and etiology. Discussed Aveeno or baking soda sitz bath for comfort. Avoid moist clothes  for extended period of time. If working out in gym clothes for long periods of time change underwear  if possible. Olive Oil use for skin protection prior to activity can be used to external skin. Discussed need for treatment. Encouraged to try sitz bath for comfort. Can use OTC Aveeno anti itch cream only if needed. Rx: Metrogel see order Rx Diflucan see order with instructions Lab: GC,Chlamydia, HIV,RPR,HEP. C Discussed STD prevention with condom use. Questions addressed.  Rv prn

## 2013-11-15 LAB — HEPATITIS C ANTIBODY: HCV AB: NEGATIVE

## 2013-11-15 LAB — HIV ANTIBODY (ROUTINE TESTING W REFLEX): HIV 1&2 Ab, 4th Generation: NONREACTIVE

## 2013-11-15 LAB — RPR

## 2013-11-16 LAB — IPS N GONORRHOEA AND CHLAMYDIA BY PCR

## 2013-11-24 NOTE — Progress Notes (Signed)
Reviewed personally.  M. Suzanne Kaelah Hayashi, MD.  

## 2013-12-04 ENCOUNTER — Encounter: Payer: Self-pay | Admitting: Certified Nurse Midwife

## 2013-12-20 ENCOUNTER — Ambulatory Visit: Payer: 59 | Admitting: Certified Nurse Midwife

## 2014-01-05 ENCOUNTER — Telehealth: Payer: Self-pay | Admitting: Certified Nurse Midwife

## 2014-01-05 NOTE — Telephone Encounter (Signed)
Pt still having some symptoms of a bacterial infection. Would like to speak with nurse.

## 2014-01-05 NOTE — Telephone Encounter (Signed)
Spoke with patient. Patient states that she was seen back in October and treated for a bacterial infection with metrogel. Completed course and symptoms went away. After patient had next cycle symptoms returned. Patient used Metrogel again and symptoms went away until 2 days ago. "It is like every time I have my period or sex that symptoms come back." Patient is having vaginal itching with discharge and odor. Advised patient will need to be seen for evaluation with Regina Eck CNM. Patient is agreeable. Declines appointment for today due to work. Appointment scheduled for Monday 12/7 at 9:15am with Regina Eck CNM. Agreeable to date and time.  Routing to provider for final review. Patient agreeable to disposition. Will close encounter

## 2014-01-08 ENCOUNTER — Ambulatory Visit (INDEPENDENT_AMBULATORY_CARE_PROVIDER_SITE_OTHER): Payer: 59 | Admitting: Certified Nurse Midwife

## 2014-01-08 VITALS — BP 134/60 | HR 78 | Resp 16 | Ht 67.75 in | Wt 152.0 lb

## 2014-01-08 DIAGNOSIS — B3731 Acute candidiasis of vulva and vagina: Secondary | ICD-10-CM

## 2014-01-08 DIAGNOSIS — N762 Acute vulvitis: Secondary | ICD-10-CM

## 2014-01-08 DIAGNOSIS — B373 Candidiasis of vulva and vagina: Secondary | ICD-10-CM

## 2014-01-08 MED ORDER — FLUCONAZOLE 150 MG PO TABS: ORAL_TABLET | ORAL | Status: DC

## 2014-01-08 NOTE — Patient Instructions (Signed)

## 2014-01-08 NOTE — Progress Notes (Signed)
48 y.o. Married Caucasian  g1p1 here with complaint of vaginal symptoms of itching,and increase discharge. Describes discharge as yellow with odor on occasion. Patient had itching after menses and treated self with Metrogel she had. Then had sexual activity same partner 2 weeks later and symptoms reoccurred. Treated with 5 days of Metrogel and seemed better and now notes just itching after tampon use. Patient uses different soaps also.Onset of symptoms 21 days ago. Denies new personal products or vaginal dryness. no STD concerns. Urinary symptoms none .   O:Healthy female WDWN Affect: normal, orientation x 3  Exam: Abdomen: non tender Lymph node: no enlargement or tenderness Pelvic exam: External genital: increase pink with cracking and small superficial tears, with exudate noted , wet prep taken  BUS: negative Vagina: white thick discharge noted. Ph:4.0  ,Wet prep taken Cervix: normal, non tender Uterus: normal, non tender Adnexa:normal, non tender, no masses or fullness noted   Wet Prep results: positive for yeast   A:Yeast vaginitis/vulvitis   P:Discussed findings of yeast vaginitis/vulvitis and etiology. Discussed Aveeno or baking soda sitz bath for comfort. Avoid moist clothes or pads for extended period of time. If working out in gym clothes for long periods of time change underwear if possible. Olive Oil use for skin protection prior to activity can be used to external skin. Rx: Diflucan see order Rx Mycolog did not order, patient feels she has Rx from last treated, will call if does not. Questions addressed.  Rv prn

## 2014-01-10 ENCOUNTER — Encounter: Payer: Self-pay | Admitting: Certified Nurse Midwife

## 2014-01-10 NOTE — Progress Notes (Signed)
Reviewed personally.  M. Suzanne Caira Poche, MD.  

## 2014-01-30 ENCOUNTER — Ambulatory Visit (INDEPENDENT_AMBULATORY_CARE_PROVIDER_SITE_OTHER): Payer: 59 | Admitting: Certified Nurse Midwife

## 2014-01-30 ENCOUNTER — Encounter: Payer: Self-pay | Admitting: Certified Nurse Midwife

## 2014-01-30 VITALS — BP 118/78 | HR 68 | Resp 16 | Ht 67.5 in | Wt 151.0 lb

## 2014-01-30 DIAGNOSIS — Z23 Encounter for immunization: Secondary | ICD-10-CM

## 2014-01-30 DIAGNOSIS — Z124 Encounter for screening for malignant neoplasm of cervix: Secondary | ICD-10-CM | POA: Diagnosis not present

## 2014-01-30 DIAGNOSIS — Z3009 Encounter for other general counseling and advice on contraception: Secondary | ICD-10-CM

## 2014-01-30 DIAGNOSIS — N898 Other specified noninflammatory disorders of vagina: Secondary | ICD-10-CM | POA: Diagnosis not present

## 2014-01-30 DIAGNOSIS — Z0000 Encounter for general adult medical examination without abnormal findings: Secondary | ICD-10-CM | POA: Diagnosis not present

## 2014-01-30 DIAGNOSIS — Z01419 Encounter for gynecological examination (general) (routine) without abnormal findings: Secondary | ICD-10-CM

## 2014-01-30 LAB — POCT URINALYSIS DIPSTICK
BILIRUBIN UA: NEGATIVE
Blood, UA: NEGATIVE
Glucose, UA: NEGATIVE
Ketones, UA: NEGATIVE
Leukocytes, UA: NEGATIVE
Nitrite, UA: NEGATIVE
PROTEIN UA: NEGATIVE
UROBILINOGEN UA: NEGATIVE
pH, UA: 5

## 2014-01-30 MED ORDER — NORETHIN ACE-ETH ESTRAD-FE 1-20 MG-MCG(24) PO TABS: 1.0000 | ORAL_TABLET | Freq: Every day | ORAL | Status: DC

## 2014-01-30 NOTE — Patient Instructions (Signed)

## 2014-01-30 NOTE — Progress Notes (Signed)
48 y.o. G32P0010 Married Caucasian Fe here for annual exam.Periods normal, no issues. Scant to no bleeding, with menses. Completed medication for yeast vaginitis, seen for on 01/714. Patient feels 99 % better, but wonders if something still not balanced. Denies vaginal itching, burning or discharge change, no odor or new personal products. Sees PCP prn. Desires screening labs today. No other health issues today.  Patient's last menstrual period was 12/11/2013.          Sexually active: Yes.    The current method of family planning is OCP (estrogen/progesterone).    Exercising: Yes.    Circuit training, running. Smoker:  no  Health Maintenance: Pap:  11/2012 Neg MMG: 07/2011 BIRADS2:Benign  Colonoscopy:  09/2012 Polyps - Repeat 5 years  BMD:   Never TDaP: > 10 years?  Labs: Here today. Drawn.  Hg:13.4 IF:OYDXA   reports that she has never smoked. She has never used smokeless tobacco. She reports that she drinks alcohol. She reports that she does not use illicit drugs.  Past Medical History  Diagnosis Date  . Seasonal allergies   . Heart murmur   . Abnormal Pap smear     +HPV  . STD (sexually transmitted disease)     HSV1  . Depression   . Infertility, female     Past Surgical History  Procedure Laterality Date  . Colonoscopy  2008, 2014    polyps removed  . Mandible surgery  1992  . Ectopic pregnancy surgery  2010  . Colposcopy    . Cryotherapy    . Ectopic pregnancy surgery      right tube removal  . Dermoid cyst  excision      6cm    Current Outpatient Prescriptions  Medication Sig Dispense Refill  . ACYCLOVIR PO Take by mouth as needed.    . B Complex Vitamins (B COMPLEX PO) Take 1 tablet by mouth daily.    Marland Kitchen BIOTIN PO Take by mouth daily.    . cetirizine (ZYRTEC) 10 MG tablet Take 10 mg by mouth daily.    . Cholecalciferol (VITAMIN D PO) Take by mouth daily.    . Coenzyme Q10 (CO Q 10 PO) Take 1 tablet by mouth daily.    . Flaxseed, Linseed, (FLAXSEED OIL PO) Take  by mouth. occ    . Multiple Vitamins-Minerals (MULTIVITAMIN PO) Take 1 tablet by mouth daily.    . Norethindrone Acetate-Ethinyl Estrad-FE (LOESTRIN 24 FE) 1-20 MG-MCG(24) tablet Take 1 tablet by mouth daily. 1 Package 2  . nystatin-triamcinolone ointment (MYCOLOG) as needed.  1  . tretinoin (RETIN-A) 0.05 % cream Apply 1 application topically 3 (three) times a week.     No current facility-administered medications for this visit.    Family History  Problem Relation Age of Onset  . Colon polyps Mother   . Colon cancer Neg Hx   . Esophageal cancer Neg Hx   . Rectal cancer Neg Hx   . Stomach cancer Neg Hx   . Heart disease Father   . Heart disease Sister     heart stint  . Stroke Maternal Grandmother   . Hypertension Maternal Grandmother     ROS:  Pertinent items are noted in HPI.  Otherwise, a comprehensive ROS was negative.  Exam:   BP 118/78 mmHg  Pulse 68  Resp 16  Ht 5' 7.5" (1.715 m)  Wt 151 lb (68.493 kg)  BMI 23.29 kg/m2  LMP 12/11/2013 Height: 5' 7.5" (171.5 cm)  Ht Readings from Last  3 Encounters:  01/30/14 5' 7.5" (1.715 m)  01/08/14 5' 7.75" (1.721 m)  11/14/13 5' 7.75" (1.721 m)    General appearance: alert, cooperative and appears stated age Head: Normocephalic, without obvious abnormality, atraumatic Neck: no adenopathy, supple, symmetrical, trachea midline and thyroid normal to inspection and palpation Lungs: clear to auscultation bilaterally Breasts: normal appearance, no masses or tenderness, No nipple retraction or dimpling, No nipple discharge or bleeding, No axillary or supraclavicular adenopathy Heart: regular rate and rhythm Abdomen: soft, non-tender; no masses,  no organomegaly Extremities: extremities normal, atraumatic, no cyanosis or edema Skin: Skin color, texture, turgor normal. No rashes or lesions Lymph nodes: Cervical, supraclavicular, and axillary nodes normal. No abnormal inguinal nodes palpated Neurologic: Grossly normal   Pelvic:  External genitalia:  no lesions              Urethra:  normal appearing urethra with no masses, tenderness or lesions              Bartholin's and Skene's: normal                 Vagina: normal appearing vagina with normal color and discharge, no lesions              Cervix: normal, no lesions, non tender              Pap taken: Yes.   Bimanual Exam:  Uterus:  normal size, contour, position, consistency, mobility, non-tender and mid position              Adnexa: normal adnexa and no mass, fullness, tenderness               Rectovaginal: Confirms               Anus:  normal sphincter tone, no lesions  A:  Well Woman with normal exam  Contraception OCP desired  ? Yeast vaginitis resolved  Screening labs  History of Cryo with abnormal pap  Mammogram due  Immunization due  P:   Reviewed health and wellness pertinent to exam  Rx Loestrin 24 Fe see order  Affirm taken, discussed normal appearing vagina and discharge, will wait on results, no wet prep done  Labs: TSH, CBC, CMP, Lipid panel,,Vit.D  Pap smear taken today with HPVHR  Requests TDAP   counseled on breast self exam, mammography screening stressed, use and side effects of OCP's, adequate intake of calcium and vitamin D, diet and exercise  return annually or prn  An After Visit Summary was printed and given to the patient.

## 2014-01-31 LAB — COMPREHENSIVE METABOLIC PANEL
ALT: 21 U/L (ref 0–35)
AST: 26 U/L (ref 0–37)
Albumin: 4.4 g/dL (ref 3.5–5.2)
Alkaline Phosphatase: 76 U/L (ref 39–117)
BUN: 10 mg/dL (ref 6–23)
CALCIUM: 9.4 mg/dL (ref 8.4–10.5)
CHLORIDE: 102 meq/L (ref 96–112)
CO2: 32 mEq/L (ref 19–32)
Creat: 0.7 mg/dL (ref 0.50–1.10)
GLUCOSE: 80 mg/dL (ref 70–99)
POTASSIUM: 4 meq/L (ref 3.5–5.3)
SODIUM: 140 meq/L (ref 135–145)
Total Bilirubin: 0.3 mg/dL (ref 0.2–1.2)
Total Protein: 7.4 g/dL (ref 6.0–8.3)

## 2014-01-31 LAB — TSH: TSH: 2.919 u[IU]/mL (ref 0.350–4.500)

## 2014-01-31 LAB — LIPID PANEL
Cholesterol: 222 mg/dL — ABNORMAL HIGH (ref 0–200)
HDL: 55 mg/dL (ref >39)
LDL CALC: 136 mg/dL — AB (ref 0–99)
Total CHOL/HDL Ratio: 4 Ratio
Triglycerides: 156 mg/dL — ABNORMAL HIGH (ref <150)
VLDL: 31 mg/dL (ref 0–40)

## 2014-01-31 LAB — CBC
HEMATOCRIT: 40.5 % (ref 36.0–46.0)
HEMOGLOBIN: 14 g/dL (ref 12.0–15.0)
MCH: 30.3 pg (ref 26.0–34.0)
MCHC: 34.6 g/dL (ref 30.0–36.0)
MCV: 87.7 fL (ref 78.0–100.0)
MPV: 9.9 fL (ref 8.6–12.4)
Platelets: 253 10*3/uL (ref 150–400)
RBC: 4.62 MIL/uL (ref 3.87–5.11)
RDW: 13.1 % (ref 11.5–15.5)
WBC: 6.3 10*3/uL (ref 4.0–10.5)

## 2014-01-31 LAB — VITAMIN D 25 HYDROXY (VIT D DEFICIENCY, FRACTURES): Vit D, 25-Hydroxy: 43 ng/mL (ref 30–100)

## 2014-01-31 LAB — HEMOGLOBIN, FINGERSTICK: Hemoglobin, fingerstick: 13.4 g/dL (ref 12.0–16.0)

## 2014-01-31 NOTE — Progress Notes (Signed)
Reviewed personally.  M. Suzanne Sitara Cashwell, MD.  

## 2014-02-01 NOTE — Addendum Note (Signed)
Addended by: Regina Eck on: 02/01/2014 01:14 PM   Modules accepted: Orders, SmartSet

## 2014-02-02 LAB — WET PREP BY MOLECULAR PROBE
Candida species: NEGATIVE
Gardnerella vaginalis: NEGATIVE
Trichomonas vaginosis: NEGATIVE

## 2014-02-05 ENCOUNTER — Telehealth: Payer: Self-pay | Admitting: Certified Nurse Midwife

## 2014-02-05 LAB — IPS PAP TEST WITH HPV

## 2014-02-05 NOTE — Telephone Encounter (Signed)
Patient's pharmacy calling re: Loestrin FE 24 brand name is not available. They do have the generic form but the prescription is marked as brand name only. Please advise.

## 2014-02-05 NOTE — Telephone Encounter (Signed)
LM for pt to call back.

## 2014-02-06 NOTE — Telephone Encounter (Signed)
We have Lo Loestrin Fe/ Minastrin 24 Fe coupon that expires 03/04/14.  -She can use it this month and then use info to get another coupon online - Verbal order from Mrs Chong Sicilian.  -She can also call pharmacy and transfer Rx to another pharmacy that carries brand name Loestrin 24 Fe.   LM for pt to call back - Second attempt.

## 2014-02-06 NOTE — Telephone Encounter (Signed)
After review of chart for Ms. Holly Hampton -06/2013 - looks like she got Brand Rx of Loestrin 24 so she could use a discount card - is another card available here or online?  Is there really a generic Loestrin 24 - or are they referring to Loestrin 1/20?

## 2014-02-08 NOTE — Telephone Encounter (Signed)
Left Message To Call Back  

## 2014-02-12 NOTE — Telephone Encounter (Addendum)
S/w patient she said it's okay for her to have the generic, she also said someone called her and left a message in regards to lab results. She wanted to know the results of the affirm test that Ms. Debbie did, I told her they were negative. Patient is aware.  [place] and s/w Amy and I confirmed that patient can have generic Loestrin 24 FE  Routed to provider for review, encounter closed.

## 2014-02-13 ENCOUNTER — Other Ambulatory Visit: Payer: Self-pay | Admitting: Certified Nurse Midwife

## 2014-02-13 ENCOUNTER — Telehealth: Payer: Self-pay

## 2014-02-13 DIAGNOSIS — IMO0002 Reserved for concepts with insufficient information to code with codable children: Secondary | ICD-10-CM

## 2014-02-13 NOTE — Telephone Encounter (Signed)
-----   Message from Regina Eck, CNM sent at 02/13/2014  8:18 AM EST ----- Notify patient that pap smear shows ASCUS H with negative HPVHR, needs colpo evaluation Order in

## 2014-02-13 NOTE — Telephone Encounter (Signed)
Spoke with patient. Advised patient of message as seen below and results from Regina Eck CNM. Patient is agreeable and verbalizes understanding. Patient is not currently on any form of birth control. "Last time I went to pick it up they did not have it in stock so I stopped taking it around 1 month ago. I think I may be entering menopause because I have not had a cycle in 2 months." Advised patient will need to call with first day of next menses to schedule colposcopy. Patient is agreeable. If patient goes a few more weeks without cycle will return call to office as well to obtain further information on how to proceed. Will keep in result hold to make sure she schedules.  Routing to provider for final review. Patient agreeable to disposition. Will close encounter

## 2014-02-27 ENCOUNTER — Telehealth: Payer: Self-pay | Admitting: Certified Nurse Midwife

## 2014-02-27 NOTE — Telephone Encounter (Signed)
Spoke with patient. Patient agreeable to schedule 03/16/14 at 10am with Dr.Miller. This will be cycle day 18 (day and time per Gay Filler). Patient advised to abstain from intercourse until after colposcopy is performed. Patient has a history of infertility and is currently taking Loestrin for OCP. Instructions given. Motrin 800 mg po x , one hour before appointment with food. Make sure to eat a meal before appointment and drink plenty of fluids. Patient verbalized understanding and will call to reschedule if will be on menses or has any concerns regarding pregnancy. Advised will need to cancel within 72 hours or will have $150.00 late cancellation fee placed to account. Patient agreeable and verbalized understanding of all instructions.  Routing to provider for final review. Patient agreeable to disposition. Will close encounter

## 2014-02-27 NOTE — Telephone Encounter (Signed)
Pt returning call. Ok to leave a detailed voicemail message regarding appointment.

## 2014-02-27 NOTE — Telephone Encounter (Signed)
Patient is ready to schedule her colposcopy was told to call when menses started. Last seen 01/30/14.

## 2014-02-27 NOTE — Telephone Encounter (Signed)
Left message to call [place] at [phone].  Offer patient appointment on 03/16/14 with Dr.Miller (date and time per Gay Filler). Patient will need to remain abstinent until after procedure.

## 2014-02-27 NOTE — Telephone Encounter (Signed)
Spoke with patient. Patient started cycle last night and would like to schedule colposcopy. Patient is going out of town from 1/31-2/9. Patient requesting to be seen this week if possible. Advised patient will need to speak with provider and return call with appointment date and time options. Patient is agreeable.

## 2014-03-16 ENCOUNTER — Ambulatory Visit (INDEPENDENT_AMBULATORY_CARE_PROVIDER_SITE_OTHER): Payer: 59 | Admitting: Obstetrics & Gynecology

## 2014-03-16 VITALS — BP 118/80 | HR 60 | Resp 16 | Wt 153.6 lb

## 2014-03-16 DIAGNOSIS — N9089 Other specified noninflammatory disorders of vulva and perineum: Secondary | ICD-10-CM

## 2014-03-16 DIAGNOSIS — R87611 Atypical squamous cells cannot exclude high grade squamous intraepithelial lesion on cytologic smear of cervix (ASC-H): Secondary | ICD-10-CM

## 2014-03-16 DIAGNOSIS — IMO0002 Reserved for concepts with insufficient information to code with codable children: Secondary | ICD-10-CM

## 2014-03-16 DIAGNOSIS — R896 Abnormal cytological findings in specimens from other organs, systems and tissues: Secondary | ICD-10-CM

## 2014-03-16 NOTE — Progress Notes (Signed)
49 y.o. G3A1 Married female here for colposcopy with possible biopsies and/or ECC due to ASCUS H Pap with neg HR HPV obtained 01/30/14.  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    Patient's last menstrual period was 02/27/2014.          Sexually active: Yes.    The current method of family planning is OCP (estrogen/progesterone).     Patient has been counseled about results and procedure.  Risks and benefits have bene reviewed including immediate and/or delayed bleeding, infection, cervical scaring from procedure, possibility of needing additional follow up as well as treatment.  rare risks of missing a lesion discussed as well.  All questions answered.  Pt ready to proceed.  BP 118/80 mmHg  Pulse 60  Resp 16  Wt 153 lb 9.6 oz (69.673 kg)  LMP 02/27/2014  General appearance: alert, cooperative and appears stated age Head: Normocephalic, without obvious abnormality, atraumatic Neurologic: Grossly normal  Pelvic: External genitalia:  Raised, pigmented lesion on left labium majora--pointed out to pt.  She requests removal today if possible              Urethra:  normal appearing urethra with no masses, tenderness or lesions              Bartholins and Skenes: normal                 Vagina: normal appearing vagina with normal color and discharge, no lesions              Cervix: no lesions              Pap taken: No.  Speculum placed.  3% acetic acid applied to cervix for >45 seconds.  Cervix visualized with both 7.5X and 15X magnification.  Green filter also used.  Lugols solution was used.  Findings:  No AWE, No abnormal staining.  Biopsy:  Not obatined.  ECC:  was performed.  Monsel's was not needed.  Excellent hemostasis was present.  Pt tolerated procedure well and all instruments were removed.    Vulvar biopsy then performed.  Area cleansed with Betadine x 3.  1/2 cc 1% Lidocaine instilled beneath lesion.  With pick-us and  sterile scissors, lesion excised.  Silver nitrate used for excellent hemostasis.    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  Pathology results will be called to pt.  If ECC neg, repeat Pap six months.

## 2014-03-20 LAB — IPS OTHER TISSUE BIOPSY

## 2014-03-26 ENCOUNTER — Telehealth: Payer: Self-pay | Admitting: Emergency Medicine

## 2014-03-26 NOTE — Telephone Encounter (Signed)
Message left to return call to Adriene Padula at [phone].    

## 2014-03-26 NOTE — Telephone Encounter (Signed)
-----   Message from Lyman Speller, MD sent at 03/24/2014  5:58 AM EST ----- Please inform pt the ECC was negative for abnormal cells.  She needs a repeat pap in 6 months (06) recall.  I also removed a raised vulvar lesion from her labium.  The pathology showed inflammation and no abnormal cells.  As long as it is healing fine, nothing else is needed for this.

## 2014-03-27 NOTE — Telephone Encounter (Signed)
Spoke with patient. Results given as seen below. Patient is agreeable and verbalizes understanding. Appointment scheduled for 09/14/2014 at 9:15am with Dr.Miller. Patient is agreeable to date and time. 06 recall entered.  Routing to provider for final review. Patient agreeable to disposition. Will close encounter

## 2014-09-14 ENCOUNTER — Ambulatory Visit (INDEPENDENT_AMBULATORY_CARE_PROVIDER_SITE_OTHER): Payer: 59 | Admitting: Obstetrics & Gynecology

## 2014-09-14 ENCOUNTER — Encounter: Payer: Self-pay | Admitting: Obstetrics & Gynecology

## 2014-09-14 VITALS — BP 118/62 | HR 64 | Resp 20 | Wt 159.0 lb

## 2014-09-14 DIAGNOSIS — N938 Other specified abnormal uterine and vaginal bleeding: Secondary | ICD-10-CM | POA: Diagnosis not present

## 2014-09-14 DIAGNOSIS — R87611 Atypical squamous cells cannot exclude high grade squamous intraepithelial lesion on cytologic smear of cervix (ASC-H): Secondary | ICD-10-CM | POA: Diagnosis not present

## 2014-09-14 NOTE — Progress Notes (Signed)
.  49 y.o. G108P0010 Married Caucasian  F here for repeat Pap smear due to history of ASC cannot exclude high grade lesion [facility]) with neg HR HPV 01/30/14.  Colposcopy was done 03/16/14 showing no findings on colposcopy.  ECC was done and was negative.    Vulvar biopsy also done showing: LEFT LABIUM MAJORA, BIOPSY:  -MILD HYPERKERATOSIS, PARAKERATOSIS AND SQUAMOUS EPITHELIAL HYPERPLASIA.  -MILD SUPERFICIAL CHRONIC INFLAMMATION AND PIGMENT CONSISTENT WITH POST INFLAMMATORY CHANGES.  -NEGATIVE FOR DYSPLASIA OR MALIGNANCY.  Patient reports during the past few months, she's had some significant break through bleeding.  Cycles are "all over the place" despite being on OCPs.  She is taking Loestrin 24 but a generic for this.  No significant cramping.  Doesn't know what to do when she is having break through bleeding.  Also report hx of dermoid on an ovary.  This is new information to me.  Patient's last menstrual period was 08/28/2014.Marland Kitchen  Contraception: OCPs  ROS:  Complete ROS questions is negative except for above  EXAM: BP 118/62 mmHg  Pulse 64  Resp 20  Wt 72.122 kg (159 lb)  LMP 08/28/2014 General appearance:  WNWD Female, NAD Pelvic exam:  VULVA: normal appearing vulva with no masses, tenderness or lesions, VAGINA: normal appearing vagina with normal color and discharge, no lesions, CERVIX: normal appearing cervix without discharge or lesions, UTERUS: Enlarged off to pt's left, no masses ADNEXA: normal adnexa in size, nontender and no masses. Pap smear obtained. Lymph:  No inguinal LAD noted  Assessment: ASCUS H pap, neg ECC 2/16 DUB Uterus enlarged off to pt's left H/O ovarian dermoid, per pt today  Plan: Pap pending.  Results and recommendations will be called to pt PUS will be scheduled.

## 2014-09-17 LAB — IPS PAP SMEAR ONLY

## 2014-09-20 ENCOUNTER — Ambulatory Visit (INDEPENDENT_AMBULATORY_CARE_PROVIDER_SITE_OTHER): Payer: 59 | Admitting: Obstetrics & Gynecology

## 2014-09-20 ENCOUNTER — Ambulatory Visit (INDEPENDENT_AMBULATORY_CARE_PROVIDER_SITE_OTHER): Payer: 59

## 2014-09-20 ENCOUNTER — Encounter: Payer: Self-pay | Admitting: Obstetrics & Gynecology

## 2014-09-20 VITALS — BP 110/70 | HR 72 | Resp 16 | Ht 67.5 in | Wt 156.0 lb

## 2014-09-20 DIAGNOSIS — N938 Other specified abnormal uterine and vaginal bleeding: Secondary | ICD-10-CM

## 2014-09-20 DIAGNOSIS — D251 Intramural leiomyoma of uterus: Secondary | ICD-10-CM | POA: Diagnosis not present

## 2014-09-20 MED ORDER — NORETHINDRONE 0.35 MG PO TABS: 1.0000 | ORAL_TABLET | Freq: Every day | ORAL | Status: DC

## 2014-09-20 NOTE — Progress Notes (Signed)
49 y.o.Marriedfemale here for a pelvic ultrasound.    Patient's last menstrual period was 08/28/2014.  Sexually active:  yes  Contraception: oral contraceptives (estrogen/progesterone)  FINDINGS: UTERUS: 8.3 x 5.2 x 6.0cm with uterine fibroids 3.5cm, 2.0cm, 1.7cm, and 1.2cm EMS: 3.47mm ADNEXA:   Left ovary 1.9 x 1.1 x 1.0cm   Right ovary 1.8 x 1.3 x 1.2cm CUL DE SAC: no free fluid   Endometrial biopsy recommended.  Discussed with patient.  Verbal and written consent obtained.   Procedure:  Speculum placed.  Cervix visualized and cleansed with betadine prep.  A single toothed tenaculum was applied to the anterior lip of the cervix.  Endometrial pipelle was advanced through the cervix into the endometrial cavity without difficulty.  Pipelle passed to 7.5 cm.  Suction applied and pipelle removed with good tissue sample obtained.  Tenculum removed.  No bleeding noted.  Patient tolerated procedure well.    Reviewed options for treatment of bleeding including provera and other progesterone options, myomectomy, [place], hysterectomy.  ACOG bulletin on uterine fibroids given.  Assessment:  DUB, uterine fibroids  Plan: [facility] today. Decided to change to Micronor.  Rx to pharmacy.  Follow up three months. Endometrial biopsy results will be called to pt.  ~15 minutes spent with patient >50% of time was in face to face discussion of above.

## 2014-09-21 ENCOUNTER — Telehealth: Payer: Self-pay | Admitting: Emergency Medicine

## 2014-09-21 LAB — FOLLICLE STIMULATING HORMONE: FSH: 17.6 m[IU]/mL

## 2014-09-21 NOTE — Telephone Encounter (Signed)
-----   Message from Megan Salon, MD sent at 09/17/2014  5:47 PM EDT ----- Please inform pt pap was negative.  H/O ASCUS H pap with neg findings on colposcopy.  Needs 08 recall for pap with AEX.  AEX not scheduled at this time.  Out of current recall.

## 2014-09-21 NOTE — Telephone Encounter (Signed)
08 Recall placed for annual exam.   Notified patient of results from Dr. Sabra Heck. Patient verbalized understanding of results. Patient declines to schedule annual exam at this time. Would like to schedule when close to time with calendar.    Routing to provider for final review. Patient agreeable to disposition. Will close encounter.

## 2014-09-27 ENCOUNTER — Telehealth: Payer: Self-pay

## 2014-09-27 NOTE — Telephone Encounter (Signed)
Lmtcb//kn 

## 2014-09-27 NOTE — Telephone Encounter (Signed)
-----   Message from Megan Salon, MD sent at 09/24/2014  4:18 PM EDT ----- Please inform endometrial biopsy is negative for abnormal cells.  Showed an endocervical polyp as well as benign endometrial tissue.  You called her earlier about an [facility].  She will hopefully scheduled a 3 month follow up.  Thanks.

## 2015-01-01 NOTE — Telephone Encounter (Signed)
Left several detailed messages, ok per DPR, and letter was mailed. Patient has not responded or made a 3 month recheck appointment. Please advise.//kn

## 2015-01-02 NOTE — Telephone Encounter (Signed)
OK to remove from inbox and close encounter.

## 2015-03-20 ENCOUNTER — Encounter: Payer: Self-pay | Admitting: Obstetrics & Gynecology

## 2015-07-24 ENCOUNTER — Telehealth: Payer: Self-pay | Admitting: Obstetrics & Gynecology

## 2015-07-24 NOTE — Telephone Encounter (Signed)
Patient having problems with her fibroids and would like a follow up appointment with Dr. Sabra Heck.

## 2015-07-24 NOTE — Telephone Encounter (Signed)
Return call to patient, left message to call back. Ask for triage nurse. Patient last annual 01-30-14. Last office visit 09-20-14 for ultrasound for DUB.

## 2015-07-25 NOTE — Telephone Encounter (Signed)
Return call from patient. States she was started on progesterone only pills about a year ago and feels like symptoms have started to increase. Reports prolonged menstrual cycles, sometimes lasting for 2 weeks. Not heavy, may only change pa 2-3 times per day but bleeding all the time. Pain/cramping is no worse but no better. On [place]. Advised due for annual exam as well. Will start with problem visit to address symptoms and then schedule annual.  Appointment scheduled for 08-02-15 at 330 pm with Dr Sabra Heck.  Routing to provider for final review. Patient agreeable to disposition. Will close encounter.

## 2015-08-02 ENCOUNTER — Ambulatory Visit (INDEPENDENT_AMBULATORY_CARE_PROVIDER_SITE_OTHER): Payer: 59 | Admitting: Obstetrics & Gynecology

## 2015-08-02 ENCOUNTER — Encounter: Payer: Self-pay | Admitting: Obstetrics & Gynecology

## 2015-08-02 VITALS — BP 110/68 | HR 72 | Resp 16 | Ht 67.5 in | Wt 151.0 lb

## 2015-08-02 DIAGNOSIS — N926 Irregular menstruation, unspecified: Secondary | ICD-10-CM

## 2015-08-02 DIAGNOSIS — D251 Intramural leiomyoma of uterus: Secondary | ICD-10-CM

## 2015-08-02 DIAGNOSIS — Z202 Contact with and (suspected) exposure to infections with a predominantly sexual mode of transmission: Secondary | ICD-10-CM

## 2015-08-02 NOTE — Progress Notes (Signed)
GYNECOLOGY  VISIT   HPI: 50 y.o. G63P0010 Married Caucasian female with DUB and hx of uterine fibroids who has been on [employer].  States cycles are heavy and, although, improved with the micronor are really not what she wants.  Continues to have break through bleeding that is bothersome.  Has sensation of fullness and discomfort in her pelvis.  She is sick and tired of this and wants to discuss other options.  Pt was started on micronor about a year ago and was to follow up for recheck.  Pt reports she knew she was to come back but "just didn't".   She does not want to be on any other hormonal methods.  She and I discussed IUD use today but she does not like the idea of a "foreign body" in the uterus.  We discussed endometrial ablation as well as hysterectomy.  She is leaning towards a hysterectomy.  She and I discussed the possibility of repeating her [place] to she where she is from a menopausal standpoint and also repeating the ultrasound before ultimately deciding.    Pt reports she and her husband are separating.  She feels like there just isn't any trust and requests STD testing.  She doesn't think her husband has been unfaithful but the lack of trust makes her feel like this is a prudent thing to do.  Voiced understanding with pt.   GYNECOLOGIC HISTORY: Patient's last menstrual period was 07/11/2015. Contraception: micronor  Patient Active Problem List   Diagnosis Date Noted  . DUB (dysfunctional uterine bleeding) 09/20/2014  . Fibroids, intramural 09/20/2014    Past Medical History  Diagnosis Date  . Seasonal allergies   . Heart murmur   . Abnormal Pap smear     +HPV  . STD (sexually transmitted disease)     HSV1  . Depression   . Infertility, female     Past Surgical History  Procedure Laterality Date  . Colonoscopy  2008, 2014    polyps removed  . Mandible surgery  1992  . Ectopic pregnancy surgery  2010  . Colposcopy    . Cryotherapy    . Ectopic pregnancy surgery     right tube removal  . Dermoid cyst  excision      6cm    MEDS:  Reviewed in EPIC and UTD  ALLERGIES: Review of patient's allergies indicates no known allergies.  Family History  Problem Relation Age of Onset  . Colon polyps Mother   . Colon cancer Neg Hx   . Esophageal cancer Neg Hx   . Rectal cancer Neg Hx   . Stomach cancer Neg Hx   . Heart disease Father   . Heart disease Sister     heart stint  . Stroke Maternal Grandmother   . Hypertension Maternal Grandmother     SH:  Married, non-smoker  Review of Systems  All other systems reviewed and are negative.   PHYSICAL EXAMINATION:    BP 110/68 mmHg  Pulse 72  Resp 16  Ht 5' 7.5" (1.715 m)  Wt 151 lb (68.493 kg)  BMI 23.29 kg/m2  LMP 07/11/2015    General appearance: alert, cooperative and appears stated age Abdomen: soft, non-tender; bowel sounds normal; no masses,  no organomegaly  Pelvic: External genitalia:  no lesions              Urethra:  normal appearing urethra with no masses, tenderness or lesions  Bartholins and Skenes: normal                 Vagina: normal appearing vagina with normal color and discharge, no lesions              Cervix: no lesions              Bimanual Exam:  Uterus:  enlarged, 10-12 weeks, globular consistent with fibroids weeks size              Adnexa: no mass, fullness, tenderness  Chaperone was present for exam.  Assessment: Enlarged uterus with fibroids, last PUS was 8/18 DUB, failed OPCs and micronor H/O ASCUS-H pap 2015 with negative evaluation, six month follow up pap neg  Plan: Repeat [facility] GC/CHL, HIV/RPR/HBSAg today Repeat PUS.  Will call pt to schedule this.   ~30 minutes spent with patient >50% of time was in face to face discussion of above, including treatment options and basic information about surgery.

## 2015-08-03 LAB — STD PANEL
HIV 1&2 Ab, 4th Generation: NONREACTIVE
Hepatitis B Surface Ag: NEGATIVE

## 2015-08-03 LAB — FOLLICLE STIMULATING HORMONE: FSH: 27.1 m[IU]/mL

## 2015-08-05 ENCOUNTER — Telehealth: Payer: Self-pay | Admitting: *Deleted

## 2015-08-05 NOTE — Telephone Encounter (Signed)
LMTCB

## 2015-08-05 NOTE — Telephone Encounter (Signed)
-----   Message from Megan Salon, MD sent at 08/04/2015  7:39 AM EDT ----- Please notify pt that her Hep B, HIV, and RPR were negative.  GC/Chl pending.  As well, her [place] is increased but not in full menopausal range yet.  We will proceed with doing surgical benefits and Gay Filler will contact her.  Thanks.

## 2015-08-05 NOTE — Telephone Encounter (Signed)
Patient returning call.

## 2015-08-05 NOTE — Telephone Encounter (Signed)
Returned call to patient. Patient given lab results and RN informed patient that GC/Chl results were still pending and patient would be notified when the office received those results. Patient verbalized understanding. RN also informed patient that Gay Filler, RN would be in touch in regards to surgical benefits. Patient stated understanding and asked when she would get that phone call and RN stated that once Gay Filler had approvals that she would be in touch. Patient verbalized understanding.

## 2015-08-07 ENCOUNTER — Telehealth: Payer: Self-pay | Admitting: Obstetrics & Gynecology

## 2015-08-07 LAB — HEMOGLOBIN, FINGERSTICK: Hemoglobin, fingerstick: 12.1 g/dL (ref 12.0–16.0)

## 2015-08-07 NOTE — Telephone Encounter (Signed)
Called patient to review benefits for procedure. Left voicemail to call back and review. °

## 2015-08-08 ENCOUNTER — Telehealth: Payer: Self-pay | Admitting: *Deleted

## 2015-08-08 LAB — IPS N GONORRHOEA AND CHLAMYDIA BY PCR

## 2015-08-08 NOTE — Telephone Encounter (Signed)
Left message to call Ernst [place] [phone].

## 2015-08-08 NOTE — Telephone Encounter (Signed)
Return call to patient. Notified of negative GC/CHL culture.  Encounter closed.

## 2015-08-08 NOTE — Telephone Encounter (Signed)
Return call to [place].

## 2015-08-08 NOTE — Telephone Encounter (Signed)
-----   Message from Megan Salon, MD sent at 08/08/2015 11:49 AM EDT ----- Inform GC and Chl testing are negative.

## 2015-08-08 NOTE — Telephone Encounter (Signed)
Patient said she is returning a call to [place].

## 2015-08-09 NOTE — Telephone Encounter (Signed)
Called patient to review benefits for a recommended procedure. Left Voicemail requesting a call back. °

## 2015-08-13 ENCOUNTER — Telehealth: Payer: Self-pay | Admitting: *Deleted

## 2015-08-13 NOTE — Telephone Encounter (Signed)
Call to patient to review surgery date options. Patient request August date. Surgery scheduled for 09-23-15 at 0730 at [facility]. Surgery instruction sheet reviewed and printed copy mailed to patient, see copy scanned to chart.  Routing to provider for final review. Patient agreeable to disposition. Will close encounter.

## 2015-08-15 ENCOUNTER — Encounter: Payer: Self-pay | Admitting: Obstetrics & Gynecology

## 2015-08-15 ENCOUNTER — Ambulatory Visit (INDEPENDENT_AMBULATORY_CARE_PROVIDER_SITE_OTHER): Payer: 59

## 2015-08-15 ENCOUNTER — Ambulatory Visit (INDEPENDENT_AMBULATORY_CARE_PROVIDER_SITE_OTHER): Payer: 59 | Admitting: Obstetrics & Gynecology

## 2015-08-15 VITALS — BP 122/78 | HR 60 | Resp 14 | Ht 67.5 in | Wt 150.0 lb

## 2015-08-15 DIAGNOSIS — D251 Intramural leiomyoma of uterus: Secondary | ICD-10-CM | POA: Diagnosis not present

## 2015-08-15 DIAGNOSIS — N938 Other specified abnormal uterine and vaginal bleeding: Secondary | ICD-10-CM | POA: Diagnosis not present

## 2015-08-15 DIAGNOSIS — N926 Irregular menstruation, unspecified: Secondary | ICD-10-CM | POA: Diagnosis not present

## 2015-08-15 NOTE — Progress Notes (Signed)
50 y.o. G30P0010 Married Caucasian female here for pelvic ultrasound due to enlarged uterus and menorrhagia.  Pt is contemplating treatment options.  Has decided that she does not want to use any hormonal methods.    Reviewed with pt STD testing that was done at her last visit.  She and her husband are moving towards separation.  Patient's last menstrual period was 07/11/2015.  Contraception: none  Findings:  UTERUS: 8.7 x 5.0 x 5.5cm with 2.2 x 1.7cm, 3.0 x 3.4cm, and 1.3 x 0.8cm EMS: 6.40mm ADNEXA: Left ovary: 1.7 x 1.0 x 0.9cm       Right ovary: 2.2 x 1.6 x 1.2cm CUL DE SAC: no free fluid  Discussion:  Findings reviewed with pt.  She and I discussed treatment options for fibroids including [place], myomectomy, endometrial ablation (treatment for bleeding) and hysterectomy.  Information provided.  Pt leaning towards hysterectomy.  Will discuss with family and call back with definitive decision.  Assessment:  Uterine fibroids, menorrhagia  Plan:  Pt likely will proceed with hysterectomy.  Recommending TLH/bilateral salpingectomy, possible BSO, cystoscopy if desires definitive treatment.  ~15 minutes spent with patient >50% of time was in face to face discussion of above.

## 2015-08-16 ENCOUNTER — Other Ambulatory Visit: Payer: Self-pay | Admitting: Obstetrics & Gynecology

## 2015-09-02 ENCOUNTER — Ambulatory Visit (INDEPENDENT_AMBULATORY_CARE_PROVIDER_SITE_OTHER): Payer: 59 | Admitting: Obstetrics & Gynecology

## 2015-09-02 ENCOUNTER — Encounter: Payer: Self-pay | Admitting: Obstetrics & Gynecology

## 2015-09-02 ENCOUNTER — Other Ambulatory Visit: Payer: Self-pay | Admitting: Obstetrics & Gynecology

## 2015-09-02 VITALS — BP 132/80 | HR 66 | Resp 14 | Ht 67.5 in | Wt 154.0 lb

## 2015-09-02 DIAGNOSIS — D251 Intramural leiomyoma of uterus: Secondary | ICD-10-CM

## 2015-09-02 DIAGNOSIS — N912 Amenorrhea, unspecified: Secondary | ICD-10-CM | POA: Diagnosis not present

## 2015-09-02 DIAGNOSIS — N938 Other specified abnormal uterine and vaginal bleeding: Secondary | ICD-10-CM

## 2015-09-02 LAB — POCT URINE PREGNANCY: PREG TEST UR: NEGATIVE

## 2015-09-02 MED ORDER — IBUPROFEN 800 MG PO TABS: 800.0000 mg | ORAL_TABLET | Freq: Three times a day (TID) | ORAL | 0 refills | Status: DC | PRN

## 2015-09-02 MED ORDER — OXYCODONE-ACETAMINOPHEN 5-325 MG PO TABS: 2.0000 | ORAL_TABLET | ORAL | 0 refills | Status: DC | PRN

## 2015-09-02 NOTE — Progress Notes (Signed)
50 y.o. G53P0010 MarriedCaucasian female here for discussion of treatment for menorrhagia and fibroid uterus.  Pt with hx of recent PUS showing uterus to measure 8.7 x 5.0 x 5.5cm with three fibroids.  She does not want to be on hormonal therapy and failed progesterone therapy this past year.  She is also not interested in an IUD and desires definitive treatment.  She has decided to proceed with hysterectomy.   Procedure discussed with patient.  Hospital stay, recovery and pain management all discussed.  Risks discussed including but not limited to bleeding, 1% risk of receiving a  transfusion, infection, 3-4% risk of bowel/bladder/ureteral/vascular injury discussed as well as possible need for additional surgery if injury does occur discussed.  DVT/PE and rare risk of death discussed.  My actual complications with prior surgeries discussed.  Vaginal cuff dehiscence discussed.  Hernia formation discussed.  Positioning and incision locations discussed.  Patient aware if pathology abnormal she may need additional treatment.  All questions answered.    Pt had neg pap 8/16 and H/O endometrial biopsy 8/16 showing benign endometrial tissue.  Ob Hx:   Patient's last menstrual period was 07/11/2015.          Sexually active: Yes.   Birth control: no method Last pap: 09/14/14 Neg Last MMG: 02/27/14 BIRADS1:neg Tobacco: Never  Past Surgical History:  Procedure Laterality Date  . COLONOSCOPY  2008, 2014   polyps removed  . COLPOSCOPY    . CRYOTHERAPY    . DERMOID CYST  EXCISION  left   6cm  . ECTOPIC PREGNANCY SURGERY  2010  . ECTOPIC PREGNANCY SURGERY     right tube removal  . MANDIBLE SURGERY  1992    Past Medical History:  Diagnosis Date  . Abnormal Pap smear    +HPV  . Depression   . Heart murmur   . Infertility, female   . Seasonal allergies   . STD (sexually transmitted disease)    HSV1    Allergies: Review of patient's allergies indicates no known allergies.  Current Outpatient  Prescriptions  Medication Sig Dispense Refill  . B Complex Vitamins (B COMPLEX PO) Take 1 tablet by mouth daily.    Marland Kitchen BIOTIN PO Take by mouth daily.    . cetirizine (ZYRTEC) 10 MG tablet Take 10 mg by mouth daily.    . Cholecalciferol (VITAMIN D PO) Take by mouth daily.    . Coenzyme Q10 (CO Q 10 PO) Take 1 tablet by mouth daily.    . Flaxseed, Linseed, (FLAXSEED OIL PO) Take by mouth. occ    . Multiple Vitamins-Minerals (MULTIVITAMIN PO) Take 1 tablet by mouth daily.    Marland Kitchen nystatin-triamcinolone ointment (MYCOLOG) as needed. Reported on 08/15/2015  1  . Probiotic Product (PROBIOTIC PO) Take by mouth.    . tretinoin (RETIN-A) 0.05 % cream Apply 1 application topically 3 (three) times a week.    . valACYclovir (VALTREX) 500 MG tablet TK 1 T PO BID  1   No current facility-administered medications for this visit.     ROS: A comprehensive review of systems was negative.  Exam:    BP 132/80 (BP Location: Left Arm, Patient Position: Sitting, Cuff Size: Normal)   Pulse 66   Resp 14   Ht 5' 7.5" (1.715 m)   Wt 154 lb (69.9 kg)   LMP 07/11/2015   BMI 23.76 kg/m   General appearance: alert and cooperative Head: Normocephalic, without obvious abnormality, atraumatic Neck: no adenopathy, supple, symmetrical, trachea midline and thyroid  not enlarged, symmetric, no tenderness/mass/nodules Lungs: clear to auscultation bilaterally Heart: regular rate and rhythm, S1, S2 normal, no murmur, click, rub or gallop Abdomen: soft, non-tender; bowel sounds normal; no masses,  no organomegaly Extremities: extremities normal, atraumatic, no cyanosis or edema Skin: Skin color, texture, turgor normal. No rashes or lesions Lymph nodes: Cervical, supraclavicular, and axillary nodes normal. no inguinal nodes palpated Neurologic: Grossly normal  Pelvic: External genitalia:  no lesions              Urethra: normal appearing urethra with no masses, tenderness or lesions              Bartholins and Skenes:  Bartholin's, Urethra, Skene's normal                 Vagina: normal appearing vagina with normal color and discharge, no lesions              Cervix: normal appearance              Pap taken: No.        Bimanual Exam:  Uterus:  enlarged to 8 week's size                                      Adnexa:    normal adnexa in size, nontender and no masses                                      Rectovaginal: Deferred                                      Anus:  normal sphincter tone, no lesions  A: DUB with menorrhagia, failed progesterone therapy Uterine fibroids H/O ectopic with right salpingectomy and left partial ovary removal due to dermoid    P:  TLH/bilateral salpingectomy/cystoscopy planned Rx for Motrin and Percocet given. Hysterectomy brochure given for pre and post op instructions.

## 2015-09-12 ENCOUNTER — Encounter (HOSPITAL_COMMUNITY)
Admission: RE | Admit: 2015-09-12 | Discharge: 2015-09-12 | Disposition: A | Discharge status: Home / Self Care | Payer: 59 | Source: Ambulatory Visit | Attending: Obstetrics & Gynecology | Admitting: Obstetrics & Gynecology

## 2015-09-12 ENCOUNTER — Encounter (HOSPITAL_COMMUNITY): Payer: Self-pay

## 2015-09-12 DIAGNOSIS — Z01812 Encounter for preprocedural laboratory examination: Secondary | ICD-10-CM | POA: Insufficient documentation

## 2015-09-12 DIAGNOSIS — N938 Other specified abnormal uterine and vaginal bleeding: Secondary | ICD-10-CM | POA: Diagnosis not present

## 2015-09-12 DIAGNOSIS — D251 Intramural leiomyoma of uterus: Secondary | ICD-10-CM | POA: Diagnosis not present

## 2015-09-12 HISTORY — DX: Other specified postprocedural states: Z98.890

## 2015-09-12 HISTORY — DX: Nausea with vomiting, unspecified: R11.2

## 2015-09-12 LAB — CBC
HEMATOCRIT: 37.9 % (ref 36.0–46.0)
HEMOGLOBIN: 12.5 g/dL (ref 12.0–15.0)
MCH: 30 pg (ref 26.0–34.0)
MCHC: 33 g/dL (ref 30.0–36.0)
MCV: 90.9 fL (ref 78.0–100.0)
Platelets: 245 10*3/uL (ref 150–400)
RBC: 4.17 MIL/uL (ref 3.87–5.11)
RDW: 12.7 % (ref 11.5–15.5)
WBC: 6.9 10*3/uL (ref 4.0–10.5)

## 2015-09-12 NOTE — Patient Instructions (Signed)
Your procedure is scheduled on:09/23/15  Enter through the Main Entrance at :6am Pick up desk phone and dial [phone] and inform us of your arrival.  Please call [phone] if you have any problems the morning of surgery.  Remember: Do not eat food or drink liquids, including water, after midnight:Sunday   You may brush your teeth the morning of surgery.  Take these meds the morning of surgery with a sip of water:Zyrtec  DO NOT wear jewelry, eye make-up, lipstick,body lotion, or dark fingernail polish.  (Polished toes are ok) You may wear deodorant.  If you are to be admitted after surgery, leave suitcase in car until your room has been assigned. Patients discharged on the day of surgery will not be allowed to drive home. Wear loose fitting, comfortable clothes for your ride home.

## 2015-09-22 MED ORDER — ENOXAPARIN SODIUM 40 MG/0.4ML ~~LOC~~ SOLN
40.0000 mg | SUBCUTANEOUS | Status: AC
  Administered 2015-09-23: 40 mg via SUBCUTANEOUS
  Filled 2015-09-22: qty 0.4

## 2015-09-22 MED ORDER — DEXTROSE 5 % IV SOLN
2.0000 g | INTRAVENOUS | Status: AC
  Administered 2015-09-23: 2 g via INTRAVENOUS
  Filled 2015-09-22: qty 2

## 2015-09-22 NOTE — Anesthesia Preprocedure Evaluation (Addendum)
Anesthesia Evaluation  Patient identified by MRN, date of birth, ID band Patient awake    Reviewed: Allergy & Precautions, H&P , NPO status , Patient's Chart, lab work & pertinent test results, reviewed documented beta blocker date and time   History of Anesthesia Complications (+) PONV and history of anesthetic complications  Airway Mallampati: II  TM Distance: >3 FB Neck ROM: full    Dental no notable dental hx.    Pulmonary neg pulmonary ROS,    Pulmonary exam normal breath sounds clear to auscultation       Cardiovascular negative cardio ROS   Rhythm:regular Rate:Normal     Neuro/Psych PSYCHIATRIC DISORDERS Depression negative neurological ROS     GI/Hepatic negative GI ROS, Neg liver ROS,   Endo/Other  negative endocrine ROS  Renal/GU negative Renal ROS  negative genitourinary   Musculoskeletal negative musculoskeletal ROS (+)   Abdominal   Peds negative pediatric ROS (+)  Hematology negative hematology ROS (+)   Anesthesia Other Findings   Reproductive/Obstetrics negative OB ROS                            Anesthesia Physical Anesthesia Plan  ASA: II  Anesthesia Plan: General   Post-op Pain Management:    Induction: Intravenous  Airway Management Planned: Oral ETT  Additional Equipment:   Intra-op Plan:   Post-operative Plan: Extubation in OR  Informed Consent: I have reviewed the patients History and Physical, chart, labs and discussed the procedure including the risks, benefits and alternatives for the proposed anesthesia with the patient or authorized representative who has indicated his/her understanding and acceptance.   Dental advisory given  Plan Discussed with: CRNA  Anesthesia Plan Comments:         Anesthesia Quick Evaluation

## 2015-09-23 ENCOUNTER — Encounter (HOSPITAL_COMMUNITY): Payer: Self-pay

## 2015-09-23 ENCOUNTER — Ambulatory Visit (HOSPITAL_COMMUNITY)
Admission: RE | Admit: 2015-09-23 | Discharge: 2015-09-23 | Disposition: A | Discharge status: Home / Self Care | Payer: 59 | Source: Ambulatory Visit | Attending: Obstetrics & Gynecology | Admitting: Obstetrics & Gynecology

## 2015-09-23 ENCOUNTER — Encounter (HOSPITAL_COMMUNITY)
Admission: RE | Disposition: A | Discharge status: Home / Self Care | Payer: Self-pay | Source: Ambulatory Visit | Attending: Obstetrics & Gynecology

## 2015-09-23 ENCOUNTER — Ambulatory Visit (HOSPITAL_COMMUNITY): Payer: 59 | Admitting: Anesthesiology

## 2015-09-23 DIAGNOSIS — D251 Intramural leiomyoma of uterus: Secondary | ICD-10-CM | POA: Insufficient documentation

## 2015-09-23 DIAGNOSIS — D252 Subserosal leiomyoma of uterus: Secondary | ICD-10-CM | POA: Diagnosis not present

## 2015-09-23 DIAGNOSIS — N938 Other specified abnormal uterine and vaginal bleeding: Secondary | ICD-10-CM

## 2015-09-23 DIAGNOSIS — N7011 Chronic salpingitis: Secondary | ICD-10-CM | POA: Diagnosis not present

## 2015-09-23 DIAGNOSIS — N920 Excessive and frequent menstruation with regular cycle: Secondary | ICD-10-CM | POA: Diagnosis present

## 2015-09-23 DIAGNOSIS — D259 Leiomyoma of uterus, unspecified: Secondary | ICD-10-CM | POA: Diagnosis present

## 2015-09-23 HISTORY — PX: CYSTOSCOPY: SHX5120

## 2015-09-23 HISTORY — PX: LAPAROSCOPIC HYSTERECTOMY: SHX1926

## 2015-09-23 HISTORY — PX: LAPAROSCOPIC BILATERAL SALPINGECTOMY: SHX5889

## 2015-09-23 LAB — PREGNANCY, URINE: PREG TEST UR: NEGATIVE

## 2015-09-23 LAB — HEMOGLOBIN AND HEMATOCRIT, BLOOD
HCT: 34 % — ABNORMAL LOW (ref 36.0–46.0)
Hemoglobin: 11.3 g/dL — ABNORMAL LOW (ref 12.0–15.0)

## 2015-09-23 SURGERY — HYSTERECTOMY, TOTAL, LAPAROSCOPIC
Anesthesia: General | Site: Bladder

## 2015-09-23 MED ORDER — HYDROMORPHONE HCL 1 MG/ML IJ SOLN
INTRAMUSCULAR | Status: AC
  Filled 2015-09-23: qty 1

## 2015-09-23 MED ORDER — PROPOFOL 10 MG/ML IV BOLUS
INTRAVENOUS | Status: DC | PRN
  Administered 2015-09-23: 200 mg via INTRAVENOUS

## 2015-09-23 MED ORDER — SIMETHICONE 80 MG PO CHEW: 80.0000 mg | CHEWABLE_TABLET | Freq: Four times a day (QID) | ORAL | Status: DC | PRN

## 2015-09-23 MED ORDER — KETOROLAC TROMETHAMINE 30 MG/ML IJ SOLN
INTRAMUSCULAR | Status: AC
  Filled 2015-09-23: qty 1

## 2015-09-23 MED ORDER — SUGAMMADEX SODIUM 200 MG/2ML IV SOLN
INTRAVENOUS | Status: DC | PRN
  Administered 2015-09-23: 200 mg via INTRAVENOUS

## 2015-09-23 MED ORDER — HYDROMORPHONE HCL 1 MG/ML IJ SOLN
0.2500 mg | INTRAMUSCULAR | Status: DC | PRN
  Administered 2015-09-23 (×2): 0.25 mg via INTRAVENOUS

## 2015-09-23 MED ORDER — PROPOFOL 10 MG/ML IV BOLUS
INTRAVENOUS | Status: AC
  Filled 2015-09-23: qty 20

## 2015-09-23 MED ORDER — SODIUM CHLORIDE 0.9 % IJ SOLN
INTRAMUSCULAR | Status: AC
  Filled 2015-09-23: qty 50

## 2015-09-23 MED ORDER — ROPIVACAINE HCL 5 MG/ML IJ SOLN
INTRAMUSCULAR | Status: AC
  Filled 2015-09-23: qty 30

## 2015-09-23 MED ORDER — LIDOCAINE HCL (CARDIAC) 20 MG/ML IV SOLN
INTRAVENOUS | Status: AC
  Filled 2015-09-23: qty 5

## 2015-09-23 MED ORDER — FENTANYL CITRATE (PF) 250 MCG/5ML IJ SOLN
INTRAMUSCULAR | Status: AC
Start: 2015-09-23 — End: 2015-09-23
  Filled 2015-09-23: qty 5

## 2015-09-23 MED ORDER — DEXAMETHASONE SODIUM PHOSPHATE 10 MG/ML IJ SOLN
INTRAMUSCULAR | Status: DC | PRN
  Administered 2015-09-23: 10 mg via INTRAVENOUS

## 2015-09-23 MED ORDER — BUPIVACAINE HCL (PF) 0.25 % IJ SOLN
INTRAMUSCULAR | Status: DC | PRN
  Administered 2015-09-23: 12 mL

## 2015-09-23 MED ORDER — DEXAMETHASONE SODIUM PHOSPHATE 10 MG/ML IJ SOLN
INTRAMUSCULAR | Status: AC
  Filled 2015-09-23: qty 1

## 2015-09-23 MED ORDER — SODIUM CHLORIDE 0.9 % IJ SOLN
INTRAMUSCULAR | Status: DC | PRN
  Administered 2015-09-23: 10 mL

## 2015-09-23 MED ORDER — ONDANSETRON HCL 4 MG/2ML IJ SOLN
INTRAMUSCULAR | Status: AC
  Filled 2015-09-23: qty 2

## 2015-09-23 MED ORDER — KETOROLAC TROMETHAMINE 30 MG/ML IJ SOLN: 30.0000 mg | Freq: Four times a day (QID) | INTRAMUSCULAR | Status: DC

## 2015-09-23 MED ORDER — SUGAMMADEX SODIUM 200 MG/2ML IV SOLN
INTRAVENOUS | Status: AC
  Filled 2015-09-23: qty 2

## 2015-09-23 MED ORDER — FAMOTIDINE IN NACL 20-0.9 MG/50ML-% IV SOLN
20.0000 mg | Freq: Two times a day (BID) | INTRAVENOUS | Status: DC
  Filled 2015-09-23 (×2): qty 50

## 2015-09-23 MED ORDER — MENTHOL 3 MG MT LOZG: 1.0000 | LOZENGE | OROMUCOSAL | Status: DC | PRN

## 2015-09-23 MED ORDER — LACTATED RINGERS IV SOLN
INTRAVENOUS | Status: DC
  Administered 2015-09-23 (×3): via INTRAVENOUS

## 2015-09-23 MED ORDER — HYDROMORPHONE HCL 1 MG/ML IJ SOLN
INTRAMUSCULAR | Status: DC | PRN
  Administered 2015-09-23: 0.5 mg via INTRAVENOUS

## 2015-09-23 MED ORDER — SCOPOLAMINE 1 MG/3DAYS TD PT72
MEDICATED_PATCH | TRANSDERMAL | Status: AC
  Administered 2015-09-23: 1.5 mg via TRANSDERMAL
  Filled 2015-09-23: qty 1

## 2015-09-23 MED ORDER — BUPIVACAINE HCL (PF) 0.25 % IJ SOLN
INTRAMUSCULAR | Status: AC
  Filled 2015-09-23: qty 60

## 2015-09-23 MED ORDER — STERILE WATER FOR IRRIGATION IR SOLN
Status: DC | PRN
  Administered 2015-09-23: 1000 mL via INTRAVESICAL

## 2015-09-23 MED ORDER — KETOROLAC TROMETHAMINE 30 MG/ML IJ SOLN
30.0000 mg | Freq: Four times a day (QID) | INTRAMUSCULAR | Status: DC
  Administered 2015-09-23: 30 mg via INTRAVENOUS
  Filled 2015-09-23: qty 1

## 2015-09-23 MED ORDER — HYDROMORPHONE HCL 1 MG/ML IJ SOLN: 0.2500 mg | INTRAMUSCULAR | Status: DC | PRN

## 2015-09-23 MED ORDER — EPHEDRINE 5 MG/ML INJ
INTRAVENOUS | Status: AC
  Filled 2015-09-23: qty 10

## 2015-09-23 MED ORDER — MIDAZOLAM HCL 5 MG/5ML IJ SOLN
INTRAMUSCULAR | Status: DC | PRN
  Administered 2015-09-23: 2 mg via INTRAVENOUS

## 2015-09-23 MED ORDER — LACTATED RINGERS IR SOLN
Status: DC | PRN
  Administered 2015-09-23: 3000 mL

## 2015-09-23 MED ORDER — ACETAMINOPHEN 325 MG PO TABS: 650.0000 mg | ORAL_TABLET | ORAL | Status: DC | PRN

## 2015-09-23 MED ORDER — ONDANSETRON HCL 4 MG/2ML IJ SOLN: 4.0000 mg | Freq: Once | INTRAMUSCULAR | Status: DC | PRN

## 2015-09-23 MED ORDER — MORPHINE SULFATE (PF) 4 MG/ML IV SOLN: 1.0000 mg | INTRAVENOUS | Status: DC | PRN

## 2015-09-23 MED ORDER — FENTANYL CITRATE (PF) 100 MCG/2ML IJ SOLN
INTRAMUSCULAR | Status: DC | PRN
  Administered 2015-09-23: 150 ug via INTRAVENOUS
  Administered 2015-09-23 (×2): 50 ug via INTRAVENOUS

## 2015-09-23 MED ORDER — LIDOCAINE HCL 1 % IJ SOLN
INTRAMUSCULAR | Status: AC
  Filled 2015-09-23: qty 20

## 2015-09-23 MED ORDER — PHENYLEPHRINE 40 MCG/ML (10ML) SYRINGE FOR IV PUSH (FOR BLOOD PRESSURE SUPPORT)
PREFILLED_SYRINGE | INTRAVENOUS | Status: AC
  Filled 2015-09-23: qty 10

## 2015-09-23 MED ORDER — LIDOCAINE 2% (20 MG/ML) 5 ML SYRINGE
INTRAMUSCULAR | Status: DC | PRN
  Administered 2015-09-23: 60 mg via INTRAVENOUS

## 2015-09-23 MED ORDER — DEXTROSE-NACL 5-0.45 % IV SOLN
INTRAVENOUS | Status: DC
  Administered 2015-09-23: 16:00:00 via INTRAVENOUS

## 2015-09-23 MED ORDER — ALUM & MAG HYDROXIDE-SIMETH 200-200-20 MG/5ML PO SUSP: 30.0000 mL | ORAL | Status: DC | PRN

## 2015-09-23 MED ORDER — ROPIVACAINE HCL 5 MG/ML IJ SOLN
INTRAMUSCULAR | Status: DC | PRN
  Administered 2015-09-23: 20 mL

## 2015-09-23 MED ORDER — ROCURONIUM BROMIDE 100 MG/10ML IV SOLN
INTRAVENOUS | Status: AC
  Filled 2015-09-23: qty 1

## 2015-09-23 MED ORDER — ROCURONIUM BROMIDE 100 MG/10ML IV SOLN
INTRAVENOUS | Status: DC | PRN
  Administered 2015-09-23: 50 mg via INTRAVENOUS
  Administered 2015-09-23 (×3): 10 mg via INTRAVENOUS

## 2015-09-23 MED ORDER — SCOPOLAMINE 1 MG/3DAYS TD PT72
1.0000 | MEDICATED_PATCH | Freq: Once | TRANSDERMAL | Status: DC
  Administered 2015-09-23: 1.5 mg via TRANSDERMAL

## 2015-09-23 MED ORDER — KETOROLAC TROMETHAMINE 30 MG/ML IJ SOLN
INTRAMUSCULAR | Status: DC | PRN
  Administered 2015-09-23: 30 mg via INTRAVENOUS

## 2015-09-23 MED ORDER — ONDANSETRON HCL 4 MG/2ML IJ SOLN
INTRAMUSCULAR | Status: DC | PRN
  Administered 2015-09-23: 4 mg via INTRAVENOUS

## 2015-09-23 MED ORDER — OXYCODONE-ACETAMINOPHEN 5-325 MG PO TABS: 1.0000 | ORAL_TABLET | ORAL | Status: DC | PRN

## 2015-09-23 MED ORDER — MIDAZOLAM HCL 2 MG/2ML IJ SOLN
INTRAMUSCULAR | Status: AC
  Filled 2015-09-23: qty 2

## 2015-09-23 SURGICAL SUPPLY — 62 items
APL SKNCLS STERI-STRIP NONHPOA (GAUZE/BANDAGES/DRESSINGS) ×3
APL SRG 38 LTWT LNG FL B (MISCELLANEOUS)
APPLICATOR ARISTA FLEXITIP XL (MISCELLANEOUS) IMPLANT
BAG SPEC RTRVL LRG 6X4 10 (ENDOMECHANICALS)
BENZOIN TINCTURE PRP APPL 2/3 (GAUZE/BANDAGES/DRESSINGS) ×1 IMPLANT
CABLE HIGH FREQUENCY MONO STRZ (ELECTRODE) ×4 IMPLANT
CATH ROBINSON RED A/P 16FR (CATHETERS) IMPLANT
CLOTH BEACON ORANGE TIMEOUT ST (SAFETY) ×4 IMPLANT
COVER BACK TABLE 60X90IN (DRAPES) ×4 IMPLANT
COVER LIGHT HANDLE  1/PK (MISCELLANEOUS) ×1
COVER LIGHT HANDLE 1/PK (MISCELLANEOUS) ×3 IMPLANT
DILATOR CANAL MILEX (MISCELLANEOUS) ×1 IMPLANT
DISSECTOR BLUNT TIP ENDO 5MM (MISCELLANEOUS) IMPLANT
DRSG COVADERM PLUS 2X2 (GAUZE/BANDAGES/DRESSINGS) ×8 IMPLANT
DRSG OPSITE POSTOP 3X4 (GAUZE/BANDAGES/DRESSINGS) ×1 IMPLANT
DURAPREP 26ML APPLICATOR (WOUND CARE) ×4 IMPLANT
GLOVE BIOGEL PI IND STRL 7.0 (GLOVE) ×15 IMPLANT
GLOVE BIOGEL PI INDICATOR 7.0 (GLOVE) ×5
GLOVE ECLIPSE 6.5 STRL STRAW (GLOVE) ×12 IMPLANT
GOWN STRL REUS W/TWL LRG LVL3 (GOWN DISPOSABLE) ×16 IMPLANT
HEMOSTAT ARISTA ABSORB 3G PWDR (MISCELLANEOUS) IMPLANT
LIGASURE BLUNT 5MM 37CM (INSTRUMENTS) ×4 IMPLANT
LIQUID BAND (GAUZE/BANDAGES/DRESSINGS) ×1 IMPLANT
NEEDLE INSUFFLATION 120MM (ENDOMECHANICALS) ×4 IMPLANT
NS IRRIG 1000ML POUR BTL (IV SOLUTION) ×4 IMPLANT
OCCLUDER COLPOPNEUMO (BALLOONS) ×4 IMPLANT
PACK LAPAROSCOPY BASIN (CUSTOM PROCEDURE TRAY) ×4 IMPLANT
PAD TRENDELENBURG POSITION (MISCELLANEOUS) ×4 IMPLANT
POUCH LAPAROSCOPIC INSTRUMENT (MISCELLANEOUS) ×4 IMPLANT
POUCH SPECIMEN RETRIEVAL 10MM (ENDOMECHANICALS) IMPLANT
SCISSORS LAP 5X35 DISP (ENDOMECHANICALS) IMPLANT
SEALER TISSUE G2 CVD JAW 35 (ENDOMECHANICALS) IMPLANT
SEALER TISSUE G2 CVD JAW 45CM (ENDOMECHANICALS)
SET CYSTO W/LG BORE CLAMP LF (SET/KITS/TRAYS/PACK) ×4 IMPLANT
SET IRRIG TUBING LAPAROSCOPIC (IRRIGATION / IRRIGATOR) ×4 IMPLANT
SET TRI-LUMEN FLTR TB AIRSEAL (TUBING) ×4 IMPLANT
SHEARS HARMONIC ACE PLUS 36CM (ENDOMECHANICALS) IMPLANT
SLEEVE ADV FIXATION 5X100MM (TROCAR) ×4 IMPLANT
SLEEVE XCEL OPT CAN 5 100 (ENDOMECHANICALS) ×1 IMPLANT
SOLUTION ELECTROLUBE (MISCELLANEOUS) ×4 IMPLANT
STRIP CLOSURE SKIN 1/2X4 (GAUZE/BANDAGES/DRESSINGS) ×1 IMPLANT
SUT VIC AB 0 CT1 27 (SUTURE) ×8
SUT VIC AB 0 CT1 27XBRD ANBCTR (SUTURE) ×6 IMPLANT
SUT VICRYL 0 UR6 27IN ABS (SUTURE) ×1 IMPLANT
SUT VICRYL 4-0 PS2 18IN ABS (SUTURE) ×4 IMPLANT
SUT VLOC 180 0 9IN  GS21 (SUTURE) ×1
SUT VLOC 180 0 9IN GS21 (SUTURE) ×3 IMPLANT
SYR 30ML LL (SYRINGE) ×1 IMPLANT
SYR 50ML LL SCALE MARK (SYRINGE) ×8 IMPLANT
TIP UTERINE 5.1X6CM LAV DISP (MISCELLANEOUS) IMPLANT
TIP UTERINE 6.7X10CM GRN DISP (MISCELLANEOUS) IMPLANT
TIP UTERINE 6.7X6CM WHT DISP (MISCELLANEOUS) ×1 IMPLANT
TIP UTERINE 6.7X8CM BLUE DISP (MISCELLANEOUS) ×1 IMPLANT
TOWEL OR 17X24 6PK STRL BLUE (TOWEL DISPOSABLE) ×8 IMPLANT
TRAY FOLEY CATH SILVER 14FR (SET/KITS/TRAYS/PACK) ×4 IMPLANT
TROCAR ADV FIXATION 5X100MM (TROCAR) ×4 IMPLANT
TROCAR BALLN 12MMX100 BLUNT (TROCAR) IMPLANT
TROCAR PORT AIRSEAL 8X120 (TROCAR) ×4 IMPLANT
TROCAR XCEL NON-BLD 11X100MML (ENDOMECHANICALS) IMPLANT
TROCAR XCEL NON-BLD 5MMX100MML (ENDOMECHANICALS) ×4 IMPLANT
WARMER LAPAROSCOPE (MISCELLANEOUS) ×4 IMPLANT
WATER STERILE IRR 1000ML POUR (IV SOLUTION) ×4 IMPLANT

## 2015-09-23 NOTE — H&P (Signed)
Holly Hampton is an 50 y.o. female  G33A1 MWF here for definitive treatemnt of menorrhagi and uterine fibroeids.  Most recent PUS showed uterus tomeasure 8.7 x 5.0 x 5.5cm with three fibroids. She failed oral progesterone and declines other treatment.  She had negative endometrial sampling 8/16.  Pertinent Gynecological History: Menses: heavy Bleeding: menorrhagia Contraception: none and history of infertility DES exposure: denies Blood transfusions: none Sexually transmitted diseases: no past history Previous GYN Procedures: none  Last mammogram: normal Date: 1/16 Last pap: normal Date: 8/16 OB History: G1, P1   Menstrual History: Patient's last menstrual period was 08/31/2015.    Past Medical History:  Diagnosis Date  . Abnormal Pap smear    +HPV  . Depression   . Heart murmur    asymptomatic  . Infertility, female   . PONV (postoperative nausea and vomiting)   . Seasonal allergies   . STD (sexually transmitted disease)    HSV1    Past Surgical History:  Procedure Laterality Date  . COLONOSCOPY  2008, 2014   polyps removed  . COLPOSCOPY    . CRYOTHERAPY    . DERMOID CYST  EXCISION  left   6cm  . ECTOPIC PREGNANCY SURGERY  2010   right salpingectomy, partial left ovary removal  . MANDIBLE SURGERY  1992    Family History  Problem Relation Age of Onset  . Colon polyps Mother   . Heart disease Father   . Heart disease Sister     heart stint  . Stroke Maternal Grandmother   . Hypertension Maternal Grandmother   . Colon cancer Neg Hx   . Esophageal cancer Neg Hx   . Rectal cancer Neg Hx   . Stomach cancer Neg Hx     Social History:  reports that she has never smoked. She has never used smokeless tobacco. She reports that she drinks about 1.2 oz of alcohol per week . She reports that she does not use drugs.  Allergies: No Known Allergies  Prescriptions Prior to Admission  Medication Sig Dispense Refill Last Dose  . B Complex Vitamins (B COMPLEX PO) Take 1  tablet by mouth daily.   Past Week at Unknown time  . BIOTIN PO Take 1 tablet by mouth daily.    Past Month at Unknown time  . cetirizine (ZYRTEC) 10 MG tablet Take 10 mg by mouth daily.   09/23/2015 at 0530  . Cholecalciferol (VITAMIN D PO) Take 1 tablet by mouth daily.    Past Week at Unknown time  . Coenzyme Q10 (CO Q 10 PO) Take 1 tablet by mouth daily.   Past Week at Unknown time  . Flaxseed, Linseed, (FLAXSEED OIL PO) Take 1 tablet by mouth daily.    Past Month at Unknown time  . ibuprofen (ADVIL,MOTRIN) 800 MG tablet Take 1 tablet (800 mg total) by mouth every 8 (eight) hours as needed. 30 tablet 0 Past Week at Unknown time  . Multiple Vitamins-Minerals (MULTIVITAMIN PO) Take 1 tablet by mouth daily.   Past Week at Unknown time  . Probiotic Product (PROBIOTIC PO) Take 1 tablet by mouth daily.    Past Month at Unknown time  . tretinoin (RETIN-A) 0.05 % cream Apply 1 application topically 3 (three) times a week.   Past Month at Unknown time  . valACYclovir (VALTREX) 500 MG tablet TAKE 1 TABLET TWICE DAILY AS NEEDED FOR OUTBREAKS  1 09/22/2015 at Unknown time  . oxyCODONE-acetaminophen (PERCOCET) 5-325 MG tablet Take 2 tablets by mouth  every 4 (four) hours as needed. use only as much as needed to relieve pain 30 tablet 0     Review of Systems  All other systems reviewed and are negative.   Blood pressure 138/86, pulse (!) 54, temperature 97.9 F (36.6 C), temperature source Oral, resp. rate 18, last menstrual period 08/31/2015, SpO2 100 %. Physical Exam  Constitutional: She is oriented to person, place, and time. She appears well-developed and well-nourished.  Cardiovascular: Normal rate and regular rhythm.   Respiratory: Effort normal and breath sounds normal.  Neurological: She is alert and oriented to person, place, and time.  Skin: Skin is warm and dry.  Psychiatric: She has a normal mood and affect.    Results for orders placed or performed during the hospital encounter of  09/23/15 (from the past 24 hour(s))  Pregnancy, urine     Status: None   Collection Time: 09/23/15  6:00 AM  Result Value Ref Range   Preg Test, Ur NEGATIVE NEGATIVE    No results found.  Assessment/Plan: 50 yo G1PA1 (ectopic MWF here for TLH/bilateral salpingectomy/cystoscopy/possible BSO due to menorrhagia and uterine fibroids.  All questions answered.  Pt here and ready to proceed.  Hale Bogus SUZANNE 09/23/2015, 7:05 AM

## 2015-09-23 NOTE — Progress Notes (Signed)
Day of Surgery Procedure(s) (LRB): HYSTERECTOMY TOTAL LAPAROSCOPIC (N/A) LAPAROSCOPIC LEFT SALPINGECTOMY (Bilateral) CYSTOSCOPY (N/A)  Subjective: Patient reports no nausea.  Getting ready to order dinner.  Has voided once and been up walking.  Denies vaginal bleeding.  She has good pain control.  Patient would like to go home later, if possible.  Objective: I have reviewed patient's vital signs, intake and output, medications and labs. Vitals:   09/23/15 1130 09/23/15 1145 09/23/15 1242 09/23/15 1633  BP: 106/65 118/66 113/74 127/68  Pulse: (!) 56 (!) 58 67 60  Resp: 12 10 11 11   Temp: 98.4 F (36.9 C) 98.3 F (36.8 C)  98.3 F (36.8 C)  TempSrc:      SpO2: 96% 96% 99% 100%    General: alert and cooperative Resp: clear to auscultation bilaterally Cardio: regular rate and rhythm, S1, S2 normal, no murmur, click, rub or gallop GI: soft, non-tender; bowel sounds normal; no masses,  no organomegaly and incision: clean, dry and intact Extremities: extremities normal, atraumatic, no cyanosis or edema Vaginal Bleeding: none  Assessment: s/p Procedure(s): HYSTERECTOMY TOTAL LAPAROSCOPIC (N/A) LAPAROSCOPIC LEFT SALPINGECTOMY (Bilateral) CYSTOSCOPY (N/A): stable and progressing well  Plan: Advance diet Encourage ambulation Advance to PO medication Discontinue IV fluids Will check hb now  LOS: 0 days    Lyman Speller 09/23/2015, 6:31 PM

## 2015-09-23 NOTE — Discharge Instructions (Signed)
Post Op Hysterectomy Instructions Please read the instructions below. Refer to these instructions for the next few weeks. These instructions provide you with general information on caring for yourself after surgery. Your caregiver may also give you specific instructions. While your treatment has been planned according to the most current medical practices available, unavoidable problems sometimes happen. If you have any problems or questions after you leave, please call your caregiver.  HOME CARE INSTRUCTIONS Healing will take time. You will have discomfort, tenderness, swelling and bruising at the operative site for a couple of weeks. This is normal and will get better as time goes on.   Only take over-the-counter or prescription medicines for pain, discomfort or fever as directed by your caregiver.   Do not take aspirin. It can cause bleeding.   Do not drive when taking pain medication.   Follow your caregivers advice regarding diet, exercise, lifting, driving and general activities.   Resume your usual diet as directed and allowed.   Get plenty of rest and sleep.   Do not douche, use tampons, or have sexual intercourse until your caregiver gives you permission. .   Take your temperature if you feel hot or flushed.   You may shower today when you get home.  No tub bath for one week.    Do not drink alcohol until you are not taking any narcotic pain medications.   Try to have someone home with you for a week or two to help with the household activities.   Be careful over the next two to three weeks with any activities at home that involve lifting, pushing, or pulling.  Listen to your body--if something feels uncomfortable to do, then don't do it.  Make sure you and your family understands everything about your operation and recovery.   Walking up stairs is fine.  Do not sign any legal documents until you feel normal again.   Keep all your follow-up appointments as recommended by  your caregiver.   Do take of the patch behind your ear on Thursday morning.  PLEASE CALL THE OFFICE IF:  There is swelling, redness or increasing pain in the wound area.   Pus is coming from the wound.   You notice a bad smell from the wound or surgical dressing.   You have pain, redness and swelling from the intravenous site.   The wound is breaking open (the edges are not staying together).    You develop pain or bleeding when you urinate.   You develop abnormal vaginal discharge.   You have any type of abnormal reaction or develop an allergy to your medication.   You need stronger pain medication for your pain   SEEK IMMEDIATE MEDICAL CARE:  You develop a temperature of 100.5 or higher.   You develop abdominal pain.   You develop chest pain.   You develop shortness of breath.   You pass out.   You develop pain, swelling or redness of your leg.   You develop heavy vaginal bleeding with or without blood clots.   MEDICATIONS:  Restart your regular medications BUT wait one week before restarting all vitamins and mineral supplements  Use Motrin 800mg  every 8 hours for the next several days.  This will help you use less Percocet.  Use the Percocet 5/325 1-2 tabs every 4-6 hours as needed for pain.  You may use an over the counter stool softener like Colace or Dulcolax to help with starting a bowel movement.  Start the  day after you go home.  Warm liquids, fluids, and ambulation help too.  If you have not had a bowel movement in four days, you need to call the office.

## 2015-09-23 NOTE — Progress Notes (Signed)
Discharge instructions reviewed with patient and significant other.  Questions asked and answered.  IV discontinued.

## 2015-09-23 NOTE — Anesthesia Postprocedure Evaluation (Signed)
Anesthesia Post Note  Patient: Holly Hampton  Procedure(s) Performed: Procedure(s) (LRB): HYSTERECTOMY TOTAL LAPAROSCOPIC (N/A) LAPAROSCOPIC LEFT SALPINGECTOMY (Bilateral) CYSTOSCOPY (N/A)  Patient location during evaluation: [facility] Anesthesia Type: General Level of consciousness: awake, awake and alert, oriented and patient cooperative Pain management: pain level controlled Vital Signs Assessment: post-procedure vital signs reviewed and stable Respiratory status: spontaneous breathing, nonlabored ventilation and respiratory function stable Cardiovascular status: stable Postop Assessment: patient able to bend at knees, no headache, no backache and no signs of nausea or vomiting Anesthetic complications: no     Last Vitals:  Vitals:   09/23/15 1130 09/23/15 1145  BP: 106/65 118/66  Pulse: (!) 56 (!) 58  Resp: 12 10  Temp: 36.9 C 36.8 C    Last Pain:  Vitals:   09/23/15 1145  TempSrc:   PainSc: 1    Pain Goal: Patients Stated Pain Goal: 4 (09/23/15 1145)               Cora Brierley L

## 2015-09-23 NOTE — Discharge Summary (Signed)
Physician Discharge Summary  Patient ID: KAISEE MELILLO MRN: [id number] DOB/AGE: [birth year] 50 y.o.  Admit date: 09/23/2015 Discharge date: 09/23/2015  Admission Diagnoses: uterine fibroids, DUB  Discharge Diagnoses:  Active Problems:   * No active hospital problems. *   Discharged Condition: good  Hospital Course: Patient admitted through same day surgery.  She was taken to OR where TLH/left salpingectomy/cystoscopy were performed.  Surgical findings included absent right fallopian tube and evidence of prior right dermoid excision as right ovary was small.  Surgery was uneventful.  EBL 20cc.  Foley catheter was removed before leaving OR.  Patient transferred to PACU where she was stable and then to 3rd floor for the remainder of her hospitalization.  During her post-op recovery, her vitals and stable and she was AF.  In evening of POD#0, she was able to transition to oral pain medications and regular diet.  She was able to ambulate and she had good pain control.  She was also able to void on her own.  She denied vaginal bleeding.  She desired discharge so Hb was obtained.  This was 11.3, decreased from 12.5 on 09/12/15.   As she was meeting all criteria for discharge, I felt this was appropriate.  Consults: None  Significant Diagnostic Studies: labs: post op hb 11.3.  Treatments: surgery: TLH/left salpingectomy/cystoscopy  Discharge Exam: Blood pressure 127/68, pulse 60, temperature 98.3 F (36.8 C), resp. rate 11, last menstrual period 08/31/2015, SpO2 100 %. General appearance: alert and cooperative Resp: clear to auscultation bilaterally Cardio: regular rate and rhythm, S1, S2 normal, no murmur, click, rub or gallop GI: soft, non-tender; bowel sounds normal; no masses,  no organomegaly Extremities: extremities normal, atraumatic, no cyanosis or edema Incision/Wound:c/d/i  Disposition: Final discharge disposition not confirmed     Medication List    TAKE these medications   B  COMPLEX PO Take 1 tablet by mouth daily.   BIOTIN PO Take 1 tablet by mouth daily.   cetirizine 10 MG tablet Commonly known as:  ZYRTEC Take 10 mg by mouth daily.   CO Q 10 PO Take 1 tablet by mouth daily.   FLAXSEED OIL PO Take 1 tablet by mouth daily.   ibuprofen 800 MG tablet Commonly known as:  ADVIL,MOTRIN Take 1 tablet (800 mg total) by mouth every 8 (eight) hours as needed.   MULTIVITAMIN PO Take 1 tablet by mouth daily.   oxyCODONE-acetaminophen 5-325 MG tablet Commonly known as:  PERCOCET Take 2 tablets by mouth every 4 (four) hours as needed. use only as much as needed to relieve pain   PROBIOTIC PO Take 1 tablet by mouth daily.   tretinoin 0.05 % cream Commonly known as:  RETIN-A Apply 1 application topically 3 (three) times a week.   valACYclovir 500 MG tablet Commonly known as:  VALTREX TAKE 1 TABLET TWICE DAILY AS NEEDED FOR OUTBREAKS   VITAMIN D PO Take 1 tablet by mouth daily.      Follow-up Information    Lyman Speller, MD Follow up on 09/30/2015.   Specialty:  Gynecology Why:  appt time 12:45pm Contact information: [address] [place] [address] [phone]           Signed: Lyman Speller 09/23/2015, 7:41 PM

## 2015-09-23 NOTE — Anesthesia Procedure Notes (Signed)
Procedure Name: Intubation Date/Time: 09/23/2015 7:27 AM Performed by: Talbot Grumbling Pre-anesthesia Checklist: Patient identified, Emergency Drugs available, Patient being monitored, Suction available and Timeout performed Patient Re-evaluated:Patient Re-evaluated prior to inductionOxygen Delivery Method: Circle system utilized Preoxygenation: Pre-oxygenation with 100% oxygen Intubation Type: IV induction and Inhalational induction Ventilation: Mask ventilation without difficulty Laryngoscope Size: Miller and 2 Grade View: Grade I Tube type: Oral Tube size: 7.0 mm Number of attempts: 2 Airway Equipment and Method: Stylet Placement Confirmation: ETT inserted through vocal cords under direct vision,  positive ETCO2,  CO2 detector and breath sounds checked- equal and bilateral Secured at: 21 cm Tube secured with: Tape Dental Injury: Teeth and Oropharynx as per pre-operative assessment

## 2015-09-23 NOTE — Progress Notes (Signed)
Patient discharged.  To car via wheelchair, escorted by A.Dara Lords, [place]

## 2015-09-23 NOTE — Op Note (Signed)
09/23/2015  9:54 AM  PATIENT:  Holly Hampton  50 y.o. female  PRE-OPERATIVE DIAGNOSIS:  DUB, intramural fibroids, h/o right ectopic s/p salpingectomy, and right dermoid removal  POST-OPERATIVE DIAGNOSIS:  Dysfunctional uterine bleeding, intramural fibroids, h/o right ectopic s/p salpingectomy, and right dermoid removal, liver adhesions  PROCEDURE:  Procedure(s): HYSTERECTOMY TOTAL LAPAROSCOPIC LAPAROSCOPIC LEFT SALPINGECTOMY CYSTOSCOPY  SURGEON:  Dalasia Predmore SUZANNE  ASSISTANTS: Sumner Boast   ANESTHESIA:   general  ESTIMATED BLOOD LOSS: 20cc  BLOOD ADMINISTERED:none   FLUIDS: 2000ccLR  UOP: 300cc clear UOP  SPECIMEN:  Uterus, cervix, left fallopian tube  DISPOSITION OF SPECIMEN:  PATHOLOGY  FINDINGS: enlarged uterus, absent right tube, small right ovary c/w removal of prior dermoid  DESCRIPTION OF OPERATION: Patient is taken to the operating room. She is placed in the supine position. She is a running IV in place. Informed consent was present on the chart. SCDs on her lower extremities and functioning properly. General endotracheal anesthesia was administered by the anesthesia staff without difficulty. Dr. Lyndle Herrlich oversaw case. Once adequate anesthesia was confirmed the legs are placed in the low lithotomy position in [place]. Her arms were tucked by the side.   Dura prep was then used to prep the abdomen and Betadine was used to prep the inner thighs, perineum and vagina. Once 3 minutes had past the patient was draped in a normal standard fashion. The legs were lifted to the high lithotomy position. The cervix was visualized by placing a heavy weighted speculum in the posterior aspect of the vagina and using a curved Deaver retractor to the retract anteriorly. The anterior lip of the cervix was grasped with single-tooth tenaculum.  The cervix sounded to 8 cm. Pratt dilators were used to dilate the cervix up to a #21. A RUMI uterine manipulator was obtained. A #8  disposable tip was placed on the RUMI manipulator as well as a small, silver KOH ring. This was passed through the cervix and the bulb of the disposable tip was inflated with 10 cc of normal saline. There was a good fit of the KOH ring around the cervix. The tenaculum was removed. There is also good manipulation of the uterus. The speculum and retractor were removed as well. A Foley catheter was placed to straight drain. The concentrated urine was noted. Legs were lowered to the low lithotomy position and attention was turned the abdomen.  The umbilicus was everted.  A Veress needle was obtained. Syringe of sterile saline was placed on a open Veress needle.  This was passed into the umbilicus until just when the fluid started to drip.  Then low flow CO2 gas was attached the needle and the pneumoperitoneum was achieved without difficulty. Once four liters of gas was in the abdomen the Veress needle was removed and a 5 millimeter non-bladed Optiview trocar and port were passed directly to the abdomen. The laparoscope was then used to confirm intraperitoneal placement. An enlarged uterus with fibroids was noted.  Right ovary appeared normal but was small.  Left ovary was normal.  Right tube was absent.  Survey of upper abdomen showed some adhesion on the liver.  Locations for RLQ and LLQ ports were noted by transillumination of the abdominal wall.  0.25% marcaine was used to anesthetize the skin.  65mm skin incisions were made and then 63mm bladed ports were placed.  Finally a midline incision was made about 4 cm above the pubic symphysis.  The skin was incised about 1cm and a non-bladed  12 port was placed with direct visualization of the laparoscope.    Ureters were identifies.  Attention was turned to the left side. With uterus on stretch the left tube was excised off the ovary and mesosalpinx was dissected to free the tube. Then the left utero-ovarian pedicle was serially clamped cauterized and incised using the  ligasure device. Left round ligament was serially clamped cauterized and incised. The anterior and posterior peritoneum of the inferior leaf of the broad ligament were opened. The beginning of the baldder flap was created.  The bladder was taken down below the level of the KOH ring. The left uterine artery skeletonized and then just superior to the KOH ring this vessel was serially clamped, cauterized, and incised.  Attention was turned the right side.  The uterus was placed on stretch to the opposite side.  The tube was already absent.  The mesosalpinx was incised freeing the tube. Then the right uterine ovarian pedicle was serially clamped cauterized and incised. Next the right round ligament was serially clamped cauterized and incised. The anterior posterior peritoneum of the inferiorly for the broad ligament were opened. The anterior peritoneum was carried across to the dissection on the left side. The remainder of the bladder flap was created using sharp dissection. The bladder was well below the level of the KOH ring. The left uterine artery skeletonized. Then the left uterine artery, above the level of the KOH ring, was serially clamped cauterized and incised. The uterus was devascularized at this point.  The colpotomy was performed a starting in the midline and using a harmonic scalpel with the inferior edge of the open blade  This was carried around a circumferential fashion until the vaginal mucosa was completely incised in the specimen was freed.  The specimen was then delivered to the vagina.  A vaginal occlusive device was used to maintain the pneumoperitoneum  Instruments were changed with a needle driver and  Kobra grasper.  Using a 9 inch V. lock suture, the cuff was closed by incorporating the anterior and posterior vaginal mucosa in each stitch. This was carried across all the way to the left corner and a running fashion. Two stitches were brought back towards the midline and the suture was  cut flush with the vagina. The needle was brought out the pelvis. The pelvis was irrigated. All pedicles were inspected. No bleeding was noted. In Interceed was placed across vaginal cuff. Ureters were noted deep in the pelvis to be peristalsing.  At this point the procedure was completed.   The remaining instruments were removed.  The ports were removed under direct visualization of the laparoscope and the pneumoperitoneum was relieved.  The patient was taken out of Trendelenburg positioning.  Several deep breaths were given to the patient's trying to any gas the abdomen and finally the midline port was removed. The largest port was closed with a fascial closure device after the port was removed.  Suture was tied and excellent closure of the fascia was noted.  The skin was then closed with subcuticular stitches of 3-0 Vicryl. The skin was cleansed Dermabond was applied. Attention was then turned the vagina and the cuff was inspected. No bleeding was noted. The anterior posterior vaginal mucosa was incorporated in each stitch. The Foley catheter was removed.  Cystoscopy was performed.  No sutures or bladder injuries were noted.  Ureters were noted with normal urine jets from each one was seen.  Foley was replaced and the cystoscopic fluid was drained.  Sponge, lap, needle, initially counts were correct x2. Patient tolerated the procedure very well. She was awakened from anesthesia, extubated and taken to recovery in stable condition.   COUNTS:  YES  PLAN OF CARE: Transfer to PACU

## 2015-09-23 NOTE — Transfer of Care (Signed)
Immediate Anesthesia Transfer of Care Note  Patient: Holly Hampton  Procedure(s) Performed: Procedure(s): HYSTERECTOMY TOTAL LAPAROSCOPIC (N/A) LAPAROSCOPIC LEFT SALPINGECTOMY (Bilateral) CYSTOSCOPY (N/A)  Patient Location: PACU  Anesthesia Type:General  Level of Consciousness:  sedated, patient cooperative and responds to stimulation  Airway & Oxygen Therapy:Patient Spontanous Breathing and Patient connected to face mask oxgen  Post-op Assessment:  Report given to PACU RN and Post -op Vital signs reviewed and stable  Post vital signs:  Reviewed and stable  Last Vitals:  Vitals:   09/23/15 0609  BP: 138/86  Pulse: (!) 54  Resp: 18  Temp: 123XX123 C    Complications: No apparent anesthesia complications

## 2015-09-24 ENCOUNTER — Encounter (HOSPITAL_COMMUNITY): Payer: Self-pay | Admitting: Obstetrics & Gynecology

## 2015-09-30 ENCOUNTER — Encounter: Payer: Self-pay | Admitting: Obstetrics & Gynecology

## 2015-09-30 ENCOUNTER — Ambulatory Visit (INDEPENDENT_AMBULATORY_CARE_PROVIDER_SITE_OTHER): Payer: 59 | Admitting: Obstetrics & Gynecology

## 2015-09-30 VITALS — BP 124/82 | HR 64 | Resp 14 | Ht 67.75 in | Wt 150.4 lb

## 2015-09-30 DIAGNOSIS — Z9889 Other specified postprocedural states: Secondary | ICD-10-CM

## 2015-09-30 NOTE — Progress Notes (Signed)
Post Operative Visit  Procedure:HYSTERECTOMY TOTAL LAPAROSCOPIC, LAPAROSCOPIC LEFT SALPINGECTOMY, CYSTOSCOPY    Days Post-op: 7 days   Subjective: Doing well.  Had one day of spotting post operatively.  Having bowel movements and emptying her bladder normally.  Having a little urinary urgency.  No fever.  Excellent pain control.  Took one percocet.  Pathology and picture reviewed.    Objective: LMP 08/31/2015   EXAM General: alert and cooperative Resp: clear to auscultation bilaterally Cardio: regular rate and rhythm GI: soft, non-tender; bowel sounds normal; no masses,  no organomegaly and incision: clean, dry and intact Extremities: extremities normal, atraumatic, no cyanosis or edema Vaginal Bleeding: none, cuff healing well without erythema or fullness  Assessment: s/p TLH, left salpingectomy, cystoscopy  Plan: Recheck 3 weeks

## 2015-10-08 ENCOUNTER — Telehealth: Payer: Self-pay | Admitting: *Deleted

## 2015-10-08 NOTE — Telephone Encounter (Signed)
Patient is in 08 recall for 09/2015. Patient recently had a hysterectomy on 09-23-15. Please advise on recall status Thanks :)

## 2015-10-11 NOTE — Telephone Encounter (Signed)
Out of recall.  Cervical pathology with hysterectomy was negative.  Thanks.

## 2015-10-15 NOTE — Telephone Encounter (Signed)
Patient out of recall -eh

## 2015-10-21 ENCOUNTER — Encounter: Payer: Self-pay | Admitting: Obstetrics & Gynecology

## 2015-10-21 ENCOUNTER — Ambulatory Visit (INDEPENDENT_AMBULATORY_CARE_PROVIDER_SITE_OTHER): Payer: 59 | Admitting: Obstetrics & Gynecology

## 2015-10-21 VITALS — BP 108/62 | HR 78 | Resp 14 | Ht 67.75 in | Wt 149.6 lb

## 2015-10-21 DIAGNOSIS — Z9889 Other specified postprocedural states: Secondary | ICD-10-CM

## 2015-10-21 NOTE — Progress Notes (Signed)
Post Operative Visit  Procedure: HYSTERECTOMY TOTAL LAPAROSCOPIC, LAPAROSCOPIC LEFT SALPINGECTOMY, CYSTOSCOPY    Days Post-op: 4 weeks  Subjective: Reports she is having some sciatic nerve pain over the past week.  She saw her chiropractor over the weekend for an adjustment.  Went to a [employer] with work and this was the beginning of the back pain.    Also, reports she is feeling a little achy without fever.  Feels like she has a "virus".    Reports she's had "no issues" with her surgery.  Has no pain.  Denies vaginal bleeding.  No constipation issues.  Never took any pain medication for surgery.    Objective: LMP 08/31/2015   EXAM General: alert and cooperative Resp: clear to auscultation bilaterally Cardio: regular rate and rhythm GI: soft, non-tender; bowel sounds normal; no masses,  no organomegaly and incision: clean, dry and intact Extremities: extremities normal, atraumatic, no cyanosis or edema Vaginal Bleeding: none  GYN:  Cuff healing well, no fullness or erythema, non tender  Assessment: s/p TLH/left salpingectomy/cystoscopy due to menorrhagia and uterine fibroids  Plan: Follow up 1 year.  Restrictions discussed.

## 2015-10-30 ENCOUNTER — Other Ambulatory Visit: Payer: Self-pay | Admitting: Physician Assistant

## 2015-10-30 ENCOUNTER — Ambulatory Visit (INDEPENDENT_AMBULATORY_CARE_PROVIDER_SITE_OTHER): Payer: 59

## 2015-10-30 ENCOUNTER — Ambulatory Visit (INDEPENDENT_AMBULATORY_CARE_PROVIDER_SITE_OTHER): Payer: 59 | Admitting: Physician Assistant

## 2015-10-30 VITALS — BP 118/76 | HR 74 | Temp 98.0°F | Resp 17 | Ht 67.75 in | Wt 149.0 lb

## 2015-10-30 DIAGNOSIS — M25475 Effusion, left foot: Secondary | ICD-10-CM

## 2015-10-30 DIAGNOSIS — M25571 Pain in right ankle and joints of right foot: Secondary | ICD-10-CM | POA: Diagnosis not present

## 2015-10-30 DIAGNOSIS — L03116 Cellulitis of left lower limb: Secondary | ICD-10-CM

## 2015-10-30 MED ORDER — CEPHALEXIN 500 MG PO CAPS: 500.0000 mg | ORAL_CAPSULE | Freq: Three times a day (TID) | ORAL | 0 refills | Status: AC

## 2015-10-30 NOTE — Progress Notes (Signed)
Holly Hampton  MRN: [id number] DOB: 09-28-1965  Subjective:  Pt presents to clinic for left foot swelling and mild erythema for the last 2 days - she stepped off a curb on Sunday but she does not remember having pain with it at that time the pain started about 24h later.  She does not remember a bite to the area but she was walking in tall grass with sandals.  She has had no F/C.  She has local pain on the foot on the medial mid foot - she has taken motrin will some pain relief - has never had gout before.  Review of Systems  Constitutional: Negative for chills and fever.  Musculoskeletal: Negative for gait problem.    There are no active problems to display for this patient.   Current Outpatient Prescriptions on File Prior to Visit  Medication Sig Dispense Refill  . B Complex Vitamins (B COMPLEX PO) Take 1 tablet by mouth daily.    Marland Kitchen BIOTIN PO Take 1 tablet by mouth daily.     . cetirizine (ZYRTEC) 10 MG tablet Take 10 mg by mouth daily.    . Cholecalciferol (VITAMIN D PO) Take 1 tablet by mouth daily.     . Coenzyme Q10 (CO Q 10 PO) Take 1 tablet by mouth daily.    . Flaxseed, Linseed, (FLAXSEED OIL PO) Take 1 tablet by mouth daily.     . Multiple Vitamins-Minerals (MULTIVITAMIN PO) Take 1 tablet by mouth daily.    . Probiotic Product (PROBIOTIC PO) Take 1 tablet by mouth daily.     Marland Kitchen tretinoin (RETIN-A) 0.05 % cream Apply 1 application topically 3 (three) times a week.    . valACYclovir (VALTREX) 500 MG tablet TAKE 1 TABLET TWICE DAILY AS NEEDED FOR OUTBREAKS  1   No current facility-administered medications on file prior to visit.     No Known Allergies  Pt patients past, family and social history were reviewed and updated.  Objective:  BP 118/76 (BP Location: Right Arm, Patient Position: Sitting, Cuff Size: Normal)   Pulse 74   Temp 98 F (36.7 C) (Oral)   Resp 17   Ht 5' 7.75" (1.721 m)   Wt 149 lb (67.6 kg)   LMP 08/31/2015   SpO2 100%   BMI 22.82 kg/m    Physical Exam  Constitutional: She is oriented to person, place, and time and well-developed, well-nourished, and in no distress.  HENT:  Head: Normocephalic and atraumatic.  Right Ear: Hearing and external ear normal.  Left Ear: Hearing and external ear normal.  Eyes: Conjunctivae are normal.  Neck: Normal range of motion.  Pulmonary/Chest: Effort normal.  Musculoskeletal:       Left foot: There is tenderness and swelling (mild). There is normal range of motion and normal capillary refill.       Feet:  Good DP pulses bilaterally - mild warmth with the left foot compared with the right foot - swelling along the anterior surface of the foot that does not extend to the ankles  Neurological: She is alert and oriented to person, place, and time. Gait normal.  Skin: Skin is warm and dry.  Psychiatric: Mood, memory, affect and judgment normal.  Vitals reviewed.   Assessment and Plan :  Swelling of foot joint, left - Plan: CANCELED: DG Foot Complete Right  Pain in joint, ankle and foot, right - Plan: CANCELED: DG Foot Complete Right  Cellulitis of left foot - Plan: cephALEXin (KEFLEX) 500 MG capsule  With the swelling and normal xray will cover with abx due to warmth of skin - if she is not improving in the next 48h she should RTC - pt agrees and understands the plan  Windell Hummingbird PA-C  [facility] 11/01/2015 10:00 AM

## 2015-10-30 NOTE — Progress Notes (Signed)
Subjective:    Patient ID: Holly Hampton, female    DOB: 1965-05-24, 50 y.o.   MRN: [id number] Chief Complaint  Patient presents with  . Foot Swelling    Left. Noticed Monday. NKI     HPI Patient is a 50 yo female who presents today with left foot swelling x 24 hours. Patient reports swelling occurred "out of the blue" on Monday night (10/28/15) while she was walking to her car. The swelling is associated with sharp, localized pain on the medial dorsum portion of her left foot that is exacerbated by walking. Patient reports use of Ibuprofen with minimal relief. Denies trauma, travel, and recent insect bite.  No known allergies    Past Medical History:  Diagnosis Date  . Abnormal Pap smear    +HPV  . Depression   . Heart murmur    asymptomatic  . Infertility, female   . PONV (postoperative nausea and vomiting)   . Seasonal allergies   . STD (sexually transmitted disease)    HSV1    Family History  Problem Relation Age of Onset  . Colon polyps Mother   . Heart disease Father   . Heart disease Sister     heart stint  . Stroke Maternal Grandmother   . Hypertension Maternal Grandmother   . Colon cancer Neg Hx   . Esophageal cancer Neg Hx   . Rectal cancer Neg Hx   . Stomach cancer Neg Hx    Social History   Social History  . Marital status: Married    Spouse name: N/A  . Number of children: N/A  . Years of education: N/A   Occupational History  . Not on file.   Social History Main Topics  . Smoking status: Never Smoker  . Smokeless tobacco: Never Used  . Alcohol use 1.2 oz/week    2 Standard drinks or equivalent per week  . Drug use: No  . Sexual activity: Yes    Partners: Male    Birth control/ protection: Pill, None   Other Topics Concern  . Not on file   Social History Narrative  . No narrative on file   Current Outpatient Prescriptions on File Prior to Visit  Medication Sig Dispense Refill  . B Complex Vitamins (B COMPLEX PO) Take 1 tablet by  mouth daily.    Marland Kitchen BIOTIN PO Take 1 tablet by mouth daily.     . cetirizine (ZYRTEC) 10 MG tablet Take 10 mg by mouth daily.    . Cholecalciferol (VITAMIN D PO) Take 1 tablet by mouth daily.     . Coenzyme Q10 (CO Q 10 PO) Take 1 tablet by mouth daily.    . Flaxseed, Linseed, (FLAXSEED OIL PO) Take 1 tablet by mouth daily.     . Multiple Vitamins-Minerals (MULTIVITAMIN PO) Take 1 tablet by mouth daily.    . Probiotic Product (PROBIOTIC PO) Take 1 tablet by mouth daily.     Marland Kitchen tretinoin (RETIN-A) 0.05 % cream Apply 1 application topically 3 (three) times a week.    . valACYclovir (VALTREX) 500 MG tablet TAKE 1 TABLET TWICE DAILY AS NEEDED FOR OUTBREAKS  1   No current facility-administered medications on file prior to visit.     Review of Systems  Constitutional: Negative for appetite change, chills, diaphoresis, fatigue and fever.  HENT: Negative for congestion, rhinorrhea and sore throat.   Eyes: Negative for photophobia and visual disturbance.  Respiratory: Negative for chest tightness, shortness of breath and wheezing.  Cardiovascular: Positive for leg swelling (Swelling in left foot ). Negative for chest pain and palpitations.  Gastrointestinal: Negative for abdominal pain, diarrhea, nausea and vomiting.  Genitourinary: Negative for dysuria, frequency and urgency.  Musculoskeletal: Positive for joint swelling (localized to left foot). Negative for myalgias and neck pain.  Neurological: Negative for dizziness, numbness and headaches.  Psychiatric/Behavioral: Negative for agitation and behavioral problems.       Objective:   Physical Exam  Constitutional: She appears well-developed and well-nourished. No distress.  HENT:  Head: Normocephalic and atraumatic.  Right Ear: External ear normal.  Left Ear: External ear normal.  Mouth/Throat: No oropharyngeal exudate.  Eyes: Conjunctivae are normal. Pupils are equal, round, and reactive to light. Right eye exhibits no discharge. Left  eye exhibits no discharge.  Neck: Neck supple. No thyromegaly present.  Cardiovascular: Normal rate, regular rhythm, normal heart sounds and intact distal pulses.  Exam reveals no gallop and no friction rub.   No murmur heard. Pulses:      Dorsalis pedis pulses are 2+ on the right side, and 2+ on the left side.       Posterior tibial pulses are 2+ on the right side, and 2+ on the left side.  Pulmonary/Chest: Effort normal and breath sounds normal. No respiratory distress. She has no wheezes.  Abdominal: Soft. She exhibits no distension. There is no tenderness.  Musculoskeletal: Normal range of motion. She exhibits edema (Left foot edema, loss of bony landmarks) and tenderness (Pain to palpation of first and second metatarsal ).  Lymphadenopathy:    She has no cervical adenopathy.  Neurological: She is alert. She displays normal reflexes.  Skin: Skin is warm and dry. She is not diaphoretic.  Left foot slightly warmer than right foot, slight discoloration or foot. Patient has been tanning making it hard to see true color of foot    Dg Foot Complete Left  Result Date: 10/30/2015 CLINICAL DATA:  Left foot pain and swelling.  No reported injury. EXAM: LEFT FOOT - COMPLETE 3+ VIEW COMPARISON:  None. FINDINGS: There is soft tissue swelling in the dorsal left foot at the level of the metatarsals. No radiopaque foreign body. No fracture, dislocation, suspicious focal osseous lesion or appreciable arthropathy. IMPRESSION: Dorsal distal left foot soft tissue swelling. No fracture, malalignment or appreciable arthropathy. Electronically Signed   By: Ilona Sorrel M.D.   On: 10/30/2015 11:14      Assessment & Plan:  1. Swelling of foot joint, left 2. Pain in joint, ankle and foot, right Need to rule out possibility of fracture. Will do foot Xray to rule out   3. Cellulitis of left foot May be due to infectious process. Will place on Keflex  - cephALEXin (KEFLEX) 500 MG capsule; Take 1 capsule (500 mg  total) by mouth 3 (three) times daily.  Dispense: 30 capsule; Refill: 0

## 2015-10-30 NOTE — Patient Instructions (Addendum)
Use ice and motrin as needed.  It is safe to take up to Motrin 800mg  3x/day - if you are not improving in 2 days return to clinic - if it gets significantly worse tomorrow than please recheck tomorrow.  Make to to elevate foot as that will help the swelling.  Take all the antibiotics    IF you received an x-ray today, you will receive an invoice from [facility]. Please contact [facility] at [phone] with questions or concerns regarding your invoice.   IF you received labwork today, you will receive an invoice from [employer]. Please contact [facility] at [phone] with questions or concerns regarding your invoice.   Our billing staff will not be able to assist you with questions regarding bills from these companies.  You will be contacted with the lab results as soon as they are available. The fastest way to get your results is to activate your My Chart account. Instructions are located on the last page of this paperwork. If you have not heard from [place] regarding the results in 2 weeks, please contact this office.

## 2015-11-21 ENCOUNTER — Encounter: Payer: Self-pay | Admitting: Certified Nurse Midwife

## 2016-04-10 ENCOUNTER — Ambulatory Visit (INDEPENDENT_AMBULATORY_CARE_PROVIDER_SITE_OTHER): Payer: 59 | Admitting: Obstetrics & Gynecology

## 2016-04-10 VITALS — BP 118/76 | HR 80 | Resp 16 | Ht 67.75 in | Wt 140.0 lb

## 2016-04-10 DIAGNOSIS — R61 Generalized hyperhidrosis: Secondary | ICD-10-CM | POA: Diagnosis not present

## 2016-04-10 DIAGNOSIS — F4322 Adjustment disorder with anxiety: Secondary | ICD-10-CM

## 2016-04-10 MED ORDER — ESTRADIOL 1 MG PO TABS
1.0000 mg | ORAL_TABLET | Freq: Every day | ORAL | 1 refills | Status: DC
Start: 2016-04-10 — End: 2016-05-11

## 2016-04-10 NOTE — Progress Notes (Signed)
GYNECOLOGY  VISIT   HPI: 51 y.o. G34P0010 Married Caucasian female here for complaints of what feels like menopausal symptoms.  She reports she is ready to move out of her home and "start over".  She is seeing a therapist.  Husband has chronic disease and she is dealing with the guilt of leaving a relationship where there has not been intimacy for years but where there is a person with chronic illness.  Also, she reports she is dating someone new that she has known for years.  She is struggling with this and had an episode where she felt her response to a situation was excessive and where she acted like a jealous person.  He has responded poorly to this.  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  Feels she "knew better".    She reports she is waking up in the middle of the night with sweats.  She feels hot and sweaty during the day as well.  She has changed deodorants.  She is using all sorts of different products without success.  Cannot decide if symptoms are menopause related or are depression/anxiety related from life changes.  GYNECOLOGIC HISTORY: Patient's last menstrual period was 08/31/2015. Contraception: hysterectomy Menopausal hormone therapy: none  Past Medical History:  Diagnosis Date  . Abnormal Pap smear    +HPV  . Depression   . Heart murmur    asymptomatic  . Infertility, female   . PONV (postoperative nausea and vomiting)   . Seasonal allergies   . STD (sexually transmitted disease)    HSV1    Past Surgical History:  Procedure Laterality Date  . COLONOSCOPY  2008, 2014   polyps removed  . COLPOSCOPY    . CRYOTHERAPY    . CYSTOSCOPY N/A 09/23/2015   Procedure: CYSTOSCOPY;  Surgeon: Megan Salon, MD;  Location: [facility];  Service: Gynecology;  Laterality: N/A;  . DERMOID CYST  EXCISION  left   6cm  . ECTOPIC PREGNANCY SURGERY  2010   right salpingectomy, partial left ovary removal  .  LAPAROSCOPIC BILATERAL SALPINGECTOMY Bilateral 09/23/2015   Procedure: LAPAROSCOPIC LEFT SALPINGECTOMY;  Surgeon: Megan Salon, MD;  Location: [facility];  Service: Gynecology;  Laterality: Bilateral;  . LAPAROSCOPIC HYSTERECTOMY N/A 09/23/2015   Procedure: HYSTERECTOMY TOTAL LAPAROSCOPIC;  Surgeon: Megan Salon, MD;  Location: [facility];  Service: Gynecology;  Laterality: N/A;  . MANDIBLE SURGERY  1992    MEDS:  Reviewed in EPIC and UTD  ALLERGIES: Patient has no known allergies.  Family History  Problem Relation Age of Onset  . Colon polyps Mother   . Heart disease Father   . Heart disease Sister     heart stint  . Stroke Maternal Grandmother   . Hypertension Maternal Grandmother   . Colon cancer Neg Hx   . Esophageal cancer Neg Hx   . Rectal cancer Neg Hx   . Stomach cancer Neg Hx     SH:  Married, non smoker  Review of Systems  Constitutional:       Hot flashes, night seats  Respiratory: Negative.   Cardiovascular: Negative.   Gastrointestinal: Negative.   Genitourinary: Negative.   Musculoskeletal: Negative.   Skin: Negative.   Neurological: Negative.   Psychiatric/Behavioral: The patient is nervous/anxious.     PHYSICAL EXAMINATION:    BP 118/76 (BP Location: Right Arm, Patient Position: Sitting, Cuff Size: Normal)   Pulse 80   Resp 16  Ht 5' 7.75" (1.721 m)   Wt 140 lb (63.5 kg)   LMP 08/31/2015   BMI 21.44 kg/m     Physical Exam  Constitutional: She is oriented to person, place, and time. She appears well-developed and well-nourished.  Neurological: She is alert and oriented to person, place, and time.  Psychiatric: She has a normal mood and affect.    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 D/w pt starting HRT and/or SSRI for symptoms.  Risks, benefits, alternatives reviewed.  After much discussion, decided to start HRT with estradiol 1mg  daily.  rx to pharmacy.   Lab results will be called to pt and subsequent follow-up will be planned.   ~30 minutes spent with patient >50% of time was in face to face discussion of above.

## 2016-04-11 LAB — FOLLICLE STIMULATING HORMONE: FSH: 47.7 m[IU]/mL

## 2016-04-11 LAB — ESTRADIOL: Estradiol: 61 pg/mL

## 2016-04-13 ENCOUNTER — Telehealth: Payer: Self-pay | Admitting: *Deleted

## 2016-04-13 NOTE — Telephone Encounter (Signed)
Notes Recorded by Polly Cobia, CMA on 04/13/2016 at 9:10 AM EDT Pt notified. Verbalized understanding. Patient has not noticed any difference yet. She wants to try an anti anxiety medication but does not want to gain weight, so she prefers one with no weight gain side effects. Please advise.  Routed to [facility]

## 2016-04-13 NOTE — Telephone Encounter (Signed)
LM for pt to call back.

## 2016-04-13 NOTE — Telephone Encounter (Signed)
-----   Message from Megan Salon, MD sent at 04/13/2016  7:25 AM EDT ----- Please let pt know her [facility] is 47--clearly in menopausal range.  Can she tell any difference with the three days of HRT she started on Friday?

## 2016-04-14 ENCOUNTER — Telehealth: Payer: Self-pay

## 2016-04-14 MED ORDER — ESCITALOPRAM OXALATE 10 MG PO TABS: 10.0000 mg | ORAL_TABLET | Freq: Every day | ORAL | 0 refills | Status: DC

## 2016-04-14 NOTE — Telephone Encounter (Signed)
-----   Message from Megan Salon, MD sent at 04/14/2016  6:59 AM EDT ----- Ok to start lexapro (generic ok) 10mg  daily.  No weight gain side effect.  Please remind her this can cause temporary GI side effects for no more than a week.  Also, she needs to pay attention to her sleep--can cause insomnia or fatigue so whether she takes during the day or the evening doesn't matter.  I would take it in the evening.  OK to send in RX for 10mg  daily.  #30/0RF.  Pt needs follow up 2-4 weeks.  Thanks.  MSM ----- Message ----- From: Polly Cobia, CMA Sent: 04/13/2016   9:11 AM To: Megan Salon, MD  Please advise.

## 2016-04-14 NOTE — Telephone Encounter (Signed)
Spoke with patient and advised of message from Dr. Sabra Heck below. Patient verbalized understanding and agreement. Escitalopram was sent to pharmacy on file. Follow-up appointment was made for 05/11/16 @ 4pm.

## 2016-04-15 NOTE — Telephone Encounter (Signed)
Notes Recorded by Summer D O'Neal, CMA on 04/14/2016 at 11:34 AM EDT Spoke with patient and advised of message from Dr. Sabra Heck below. Patient verbalized understanding and agreement. Escitalopram was sent to pharmacy on file. Follow-up appointment was made for 05/11/16 @ 4pm.

## 2016-04-19 ENCOUNTER — Encounter: Payer: Self-pay | Admitting: Obstetrics & Gynecology

## 2016-05-11 ENCOUNTER — Encounter: Payer: Self-pay | Admitting: Obstetrics & Gynecology

## 2016-05-11 ENCOUNTER — Ambulatory Visit (INDEPENDENT_AMBULATORY_CARE_PROVIDER_SITE_OTHER): Payer: 59 | Admitting: Obstetrics & Gynecology

## 2016-05-11 VITALS — BP 112/62 | HR 72 | Resp 16 | Ht 67.25 in | Wt 140.0 lb

## 2016-05-11 DIAGNOSIS — N951 Menopausal and female climacteric states: Secondary | ICD-10-CM | POA: Diagnosis not present

## 2016-05-11 DIAGNOSIS — F4322 Adjustment disorder with anxiety: Secondary | ICD-10-CM | POA: Diagnosis not present

## 2016-05-11 MED ORDER — ESCITALOPRAM OXALATE 10 MG PO TABS: 10.0000 mg | ORAL_TABLET | Freq: Every day | ORAL | 5 refills | Status: DC

## 2016-05-11 MED ORDER — ESTRADIOL 1 MG PO TABS: 1.0000 mg | ORAL_TABLET | Freq: Every day | ORAL | 5 refills | Status: DC

## 2016-05-11 NOTE — Progress Notes (Signed)
GYNECOLOGY  VISIT   HPI: 51 y.o. G87P0010 Married Caucasian female here for follow up after starting both estradiol due to hot flashes, night sweats and sleep distrubance as well as lexapro due to adjustment disorder from marital issues and work issues.  Reports she is doing better.  Estradiol has really helped at night and with hot flashes.  Sleeping is much better.    She feels more stable emotionally and is able to focus more at work.  Feels this is due to the Lexapro.  She is seeing a Social worker but does not want to go up on the dosage.    Husband having upcoming deep brain stimulation and he recently lost his job so stressors continue but she feels she is much better able to cope with them at this time.  GYNECOLOGIC HISTORY: Patient's last menstrual period was 08/31/2015. Contraception: hysterectomy Menopausal hormone therapy: estradiol 1.0mg   Past Medical History:  Diagnosis Date  . Abnormal Pap smear    +HPV  . Depression   . Heart murmur    asymptomatic  . Infertility, female   . PONV (postoperative nausea and vomiting)   . Seasonal allergies   . STD (sexually transmitted disease)    HSV1    Past Surgical History:  Procedure Laterality Date  . COLONOSCOPY  2008, 2014   polyps removed  . COLPOSCOPY    . CRYOTHERAPY    . CYSTOSCOPY N/A 09/23/2015   Procedure: CYSTOSCOPY;  Surgeon: Megan Salon, MD;  Location: [facility];  Service: Gynecology;  Laterality: N/A;  . DERMOID CYST  EXCISION  left   6cm  . ECTOPIC PREGNANCY SURGERY  2010   right salpingectomy, partial left ovary removal  . LAPAROSCOPIC BILATERAL SALPINGECTOMY Bilateral 09/23/2015   Procedure: LAPAROSCOPIC LEFT SALPINGECTOMY;  Surgeon: Megan Salon, MD;  Location: [facility];  Service: Gynecology;  Laterality: Bilateral;  . LAPAROSCOPIC HYSTERECTOMY N/A 09/23/2015   Procedure: HYSTERECTOMY TOTAL LAPAROSCOPIC;  Surgeon: Megan Salon, MD;  Location: [facility];  Service: Gynecology;  Laterality: N/A;  . MANDIBLE SURGERY   1992    MEDS:  Reviewed in EPIC and UTD  ALLERGIES: Patient has no known allergies.  Family History  Problem Relation Age of Onset  . Colon polyps Mother   . Heart disease Father   . Heart disease Sister     heart stint  . Stroke Maternal Grandmother   . Hypertension Maternal Grandmother   . Colon cancer Neg Hx   . Esophageal cancer Neg Hx   . Rectal cancer Neg Hx   . Stomach cancer Neg Hx     SH:  Married, non smoker  Review of Systems  All other systems reviewed and are negative.   PHYSICAL EXAMINATION:    BP 112/62 (BP Location: Right Arm, Patient Position: Sitting, Cuff Size: Normal)   Pulse 72   Resp 16   Ht 5' 7.25" (1.708 m)   Wt 140 lb (63.5 kg)   LMP 08/31/2015   BMI 21.76 kg/m     General appearance: alert, cooperative and appears stated age No other exam was performed.  Assessment: Adjustment disorder, improved on lexapro Menopausal symptoms, improved as well  Plan: Continue estradiol 1.0mg  daily.  Will consider lowering dose once life stressors lessen Continue Lexapro 10mg  daily.  Rx for both to pharmacy with #5RF.  Pt knows to call for instructions for tapering if she decides she would like to stop the Lexapro. Continue to see her counselor.  Follow up as needed.  Pt aware I am here to help if needed.   ~15 minutes spent with patient >50% of time was in face to face discussion of above.

## 2016-10-26 ENCOUNTER — Encounter: Payer: Self-pay | Admitting: Obstetrics & Gynecology

## 2016-10-26 ENCOUNTER — Ambulatory Visit (INDEPENDENT_AMBULATORY_CARE_PROVIDER_SITE_OTHER): Payer: 59 | Admitting: Obstetrics & Gynecology

## 2016-10-26 ENCOUNTER — Other Ambulatory Visit (HOSPITAL_COMMUNITY)
Admission: RE | Admit: 2016-10-26 | Discharge: 2016-10-26 | Disposition: A | Discharge status: Home / Self Care | Payer: 59 | Source: Ambulatory Visit | Attending: Obstetrics & Gynecology | Admitting: Obstetrics & Gynecology

## 2016-10-26 VITALS — BP 124/70 | HR 60 | Resp 16 | Ht 67.25 in | Wt 141.0 lb

## 2016-10-26 DIAGNOSIS — Z124 Encounter for screening for malignant neoplasm of cervix: Secondary | ICD-10-CM | POA: Diagnosis not present

## 2016-10-26 DIAGNOSIS — Z0000 Encounter for general adult medical examination without abnormal findings: Secondary | ICD-10-CM

## 2016-10-26 DIAGNOSIS — Z01419 Encounter for gynecological examination (general) (routine) without abnormal findings: Secondary | ICD-10-CM

## 2016-10-26 MED ORDER — ESTRADIOL 1 MG PO TABS: 1.0000 mg | ORAL_TABLET | Freq: Every day | ORAL | 4 refills | Status: DC

## 2016-10-26 MED ORDER — CITALOPRAM HYDROBROMIDE 20 MG PO TABS: 20.0000 mg | ORAL_TABLET | Freq: Every day | ORAL | 2 refills | Status: DC

## 2016-10-26 NOTE — Progress Notes (Signed)
51 y.o. G25P0010 Married Caucasian F here for annual exam.  Reports she's recently gotten an apartment.  Feels anxiety is much better.  Very happy with resolution in hot flashes.  States "I might be one of those women at 22 who you still have on estrogen".     Denies vaginal bleeding.  Anxiety much better but having libido issues.  Would like to consider making a change in medication.  Side effect profiles reviewed.    Patient's last menstrual period was 08/31/2015.          Sexually active: Yes.    The current method of family planning is status post hysterectomy.    Exercising: Yes.    walking and running Smoker:  no  Health Maintenance: Pap:  09/14/14 negative, 01/30/14 ASCUS, HR HPV negative, 11/23/12 negative  History of abnormal Pap:  yes MMG:  11/06/15 BIRADS 1 negative  Colonoscopy:  09/02/2012 polyps- repeat 5 years  BMD:   n/a TDaP:  01/30/14  Pneumonia vaccine(s):  n/a Zostavax:   n/a Hep C testing: 11/14/13 negative  Screening Labs: discuss today   reports that she has never smoked. She has never used smokeless tobacco. She reports that she drinks about 1.2 oz of alcohol per week . She reports that she does not use drugs.  Past Medical History:  Diagnosis Date  . Abnormal Pap smear    +HPV  . Depression   . Heart murmur    asymptomatic  . Infertility, female   . PONV (postoperative nausea and vomiting)   . Seasonal allergies   . STD (sexually transmitted disease)    HSV1    Past Surgical History:  Procedure Laterality Date  . COLONOSCOPY  2008, 2014   polyps removed  . COLPOSCOPY    . CRYOTHERAPY    . CYSTOSCOPY N/A 09/23/2015   Procedure: CYSTOSCOPY;  Surgeon: Megan Salon, MD;  Location: [facility];  Service: Gynecology;  Laterality: N/A;  . DERMOID CYST  EXCISION  left   6cm  . ECTOPIC PREGNANCY SURGERY  2010   right salpingectomy, partial left ovary removal  . LAPAROSCOPIC BILATERAL SALPINGECTOMY Bilateral 09/23/2015   Procedure: LAPAROSCOPIC LEFT  SALPINGECTOMY;  Surgeon: Megan Salon, MD;  Location: [facility];  Service: Gynecology;  Laterality: Bilateral;  . LAPAROSCOPIC HYSTERECTOMY N/A 09/23/2015   Procedure: HYSTERECTOMY TOTAL LAPAROSCOPIC;  Surgeon: Megan Salon, MD;  Location: [facility];  Service: Gynecology;  Laterality: N/A;  . MANDIBLE SURGERY  1992    Current Outpatient Prescriptions  Medication Sig Dispense Refill  . B Complex Vitamins (B COMPLEX PO) Take 1 tablet by mouth daily.    Marland Kitchen BIOTIN PO Take 1 tablet by mouth daily.     . cetirizine (ZYRTEC) 10 MG tablet Take 10 mg by mouth daily.    . Cholecalciferol (VITAMIN D PO) Take 1 tablet by mouth daily.     . Coenzyme Q10 (CO Q 10 PO) Take 1 tablet by mouth daily.    Marland Kitchen escitalopram (LEXAPRO) 10 MG tablet Take 1 tablet (10 mg total) by mouth daily. 30 tablet 5  . estradiol (ESTRACE) 1 MG tablet Take 1 tablet (1 mg total) by mouth daily. 30 tablet 5  . Flaxseed, Linseed, (FLAXSEED OIL PO) Take 1 tablet by mouth daily.     Marland Kitchen ibuprofen (ADVIL,MOTRIN) 400 MG tablet Take 400 mg by mouth every 6 (six) hours as needed.    . Multiple Vitamins-Minerals (MULTIVITAMIN PO) Take 1 tablet by mouth daily.    Marland Kitchen  Probiotic Product (PROBIOTIC PO) Take 1 tablet by mouth daily.     Marland Kitchen tretinoin (RETIN-A) 0.05 % cream Apply 1 application topically 3 (three) times a week.    . valACYclovir (VALTREX) 1000 MG tablet Take 1 tablet by mouth as needed.      No current facility-administered medications for this visit.     Family History  Problem Relation Age of Onset  . Colon polyps Mother   . Heart disease Father   . Heart disease Sister        heart stint  . Stroke Maternal Grandmother   . Hypertension Maternal Grandmother   . Colon cancer Neg Hx   . Esophageal cancer Neg Hx   . Rectal cancer Neg Hx   . Stomach cancer Neg Hx     ROS:  Pertinent items are noted in HPI.  Otherwise, a comprehensive ROS was negative.  Exam:   BP 124/70 (BP Location: Right Arm, Patient Position: Sitting, Cuff  Size: Normal)   Pulse 60   Resp 16   Ht 5' 7.25" (1.708 m)   Wt 141 lb (64 kg)   LMP 08/31/2015   BMI 21.92 kg/m  Height: 5' 7.25" (170.8 cm)  Ht Readings from Last 3 Encounters:  10/26/16 5' 7.25" (1.708 m)  05/11/16 5' 7.25" (1.708 m)  04/10/16 5' 7.75" (1.721 m)    General appearance: alert, cooperative and appears stated age Head: Normocephalic, without obvious abnormality, atraumatic Neck: no adenopathy, supple, symmetrical, trachea midline and thyroid normal to inspection and palpation Lungs: clear to auscultation bilaterally Breasts: normal appearance, no masses or tenderness Heart: regular rate and rhythm Abdomen: soft, non-tender; bowel sounds normal; no masses,  no organomegaly Extremities: extremities normal, atraumatic, no cyanosis or edema Skin: Skin color, texture, turgor normal. No rashes or lesions Lymph nodes: Cervical, supraclavicular, and axillary nodes normal. No abnormal inguinal nodes palpated Neurologic: Grossly normal  Pelvic: External genitalia:  no lesions              Urethra:  normal appearing urethra with no masses, tenderness or lesions              Bartholins and Skenes: normal                 Vagina: normal appearing vagina with normal color and discharge, no lesions              Cervix: absent              Pap taken: No. Bimanual Exam:  Uterus:  uterus absent              Adnexa: normal adnexa               Rectovaginal: Confirms               Anus:  normal sphincter tone, no lesions  Chaperone was present for exam.  A:  Well Woman with normal exam PMP, on HRT H/O TLH/bilateral salpingectomy 8/17 due to bleeding, fibroids Remote hx of cryo to cervix due to abnormal pap smear H/o HSV 1  P:   Mammogram guidelines reve pap smear CBC, CMP, Lipids, TSH, Vit D Will change to Celexa 20mg  daily.  Rx to pharmacy.  Pt knows can just switch. Estradiol 1.0mg  daily.  #90/4RF. Colonoscopy due next year Does not need Valtrex rx.  Will call if  does. return annually or prn

## 2016-10-27 LAB — COMPREHENSIVE METABOLIC PANEL
ALBUMIN: 4.7 g/dL (ref 3.5–5.5)
ALT: 13 IU/L (ref 0–32)
AST: 20 IU/L (ref 0–40)
Albumin/Globulin Ratio: 1.7 (ref 1.2–2.2)
Alkaline Phosphatase: 70 IU/L (ref 39–117)
BUN / CREAT RATIO: 16 (ref 9–23)
BUN: 12 mg/dL (ref 6–24)
Bilirubin Total: 0.2 mg/dL (ref 0.0–1.2)
CO2: 26 mmol/L (ref 20–29)
CREATININE: 0.73 mg/dL (ref 0.57–1.00)
Calcium: 9.2 mg/dL (ref 8.7–10.2)
Chloride: 101 mmol/L (ref 96–106)
GFR calc non Af Amer: 96 mL/min/{1.73_m2} (ref >59)
GFR, EST AFRICAN AMERICAN: 111 mL/min/{1.73_m2} (ref >59)
GLOBULIN, TOTAL: 2.7 g/dL (ref 1.5–4.5)
GLUCOSE: 92 mg/dL (ref 65–99)
Potassium: 3.8 mmol/L (ref 3.5–5.2)
SODIUM: 142 mmol/L (ref 134–144)
TOTAL PROTEIN: 7.4 g/dL (ref 6.0–8.5)

## 2016-10-27 LAB — CBC
Hematocrit: 39.4 % (ref 34.0–46.6)
Hemoglobin: 13 g/dL (ref 11.1–15.9)
MCH: 29.9 pg (ref 26.6–33.0)
MCHC: 33 g/dL (ref 31.5–35.7)
MCV: 91 fL (ref 79–97)
PLATELETS: 258 10*3/uL (ref 150–379)
RBC: 4.35 x10E6/uL (ref 3.77–5.28)
RDW: 12.9 % (ref 12.3–15.4)
WBC: 6.5 10*3/uL (ref 3.4–10.8)

## 2016-10-27 LAB — TSH: TSH: 1.75 u[IU]/mL (ref 0.450–4.500)

## 2016-10-27 LAB — LIPID PANEL
CHOL/HDL RATIO: 3.3 ratio (ref 0.0–4.4)
Cholesterol, Total: 190 mg/dL (ref 100–199)
HDL: 58 mg/dL (ref >39)
LDL CALC: 104 mg/dL — AB (ref 0–99)
Triglycerides: 139 mg/dL (ref 0–149)
VLDL CHOLESTEROL CAL: 28 mg/dL (ref 5–40)

## 2016-10-27 LAB — VITAMIN D 25 HYDROXY (VIT D DEFICIENCY, FRACTURES): VIT D 25 HYDROXY: 34.5 ng/mL (ref 30.0–100.0)

## 2016-10-28 LAB — CYTOLOGY - PAP: DIAGNOSIS: NEGATIVE

## 2016-10-29 ENCOUNTER — Telehealth: Payer: Self-pay | Admitting: *Deleted

## 2016-10-29 NOTE — Telephone Encounter (Signed)
-----   Message from Megan Salon, MD sent at 10/29/2016  7:39 AM EDT ----- 02 recall.  Please let pt know her pap smear was normal.  She would like a phone call about this.

## 2016-10-29 NOTE — Telephone Encounter (Signed)
-----   Message from Megan Salon, MD sent at 10/28/2016 11:32 AM EDT ----- Please let pt know her CMP, thyroid and vit D were normal.  Her cholesterol was fine at 190 and LDLs were 104. This wasn't a fasting test, I'm pretty sure.  Here CBC was normal again with normal hemoglobin.

## 2016-10-29 NOTE — Telephone Encounter (Signed)
Message left to return call to Kathleen Tamm at [phone].    

## 2016-11-04 NOTE — Telephone Encounter (Signed)
Call to patient. Patient notified of all results and verbalized understanding. Patient states she was not fasting for blood work the day of her aex exam.   Patient agreeable to disposition. Will close encounter.

## 2017-01-06 ENCOUNTER — Encounter: Payer: Self-pay | Admitting: Certified Nurse Midwife

## 2017-02-24 ENCOUNTER — Other Ambulatory Visit: Payer: Self-pay | Admitting: Obstetrics & Gynecology

## 2017-02-24 NOTE — Telephone Encounter (Signed)
Medication refill request: Celexa  Last AEX:  10-26-16  Next AEX: 01-11-18  Last MMG (if hormonal medication request): 12-09-16 WNL  Refill authorized: please advise

## 2017-09-09 ENCOUNTER — Encounter: Payer: Self-pay | Admitting: Gastroenterology

## 2017-10-25 ENCOUNTER — Encounter: Payer: Self-pay | Admitting: Gastroenterology

## 2017-10-29 ENCOUNTER — Encounter: Payer: Self-pay | Admitting: Gastroenterology

## 2017-12-02 ENCOUNTER — Encounter: Payer: Self-pay | Admitting: Gastroenterology

## 2017-12-06 ENCOUNTER — Encounter: Payer: Self-pay | Admitting: Gastroenterology

## 2017-12-06 ENCOUNTER — Ambulatory Visit: Payer: 59 | Admitting: Gastroenterology

## 2017-12-06 VITALS — BP 116/70 | HR 84 | Ht 67.25 in | Wt 158.1 lb

## 2017-12-06 DIAGNOSIS — D126 Benign neoplasm of colon, unspecified: Secondary | ICD-10-CM | POA: Diagnosis not present

## 2017-12-06 DIAGNOSIS — R110 Nausea: Secondary | ICD-10-CM | POA: Diagnosis not present

## 2017-12-06 DIAGNOSIS — K219 Gastro-esophageal reflux disease without esophagitis: Secondary | ICD-10-CM

## 2017-12-06 DIAGNOSIS — R1084 Generalized abdominal pain: Secondary | ICD-10-CM

## 2017-12-06 DIAGNOSIS — R635 Abnormal weight gain: Secondary | ICD-10-CM

## 2017-12-06 MED ORDER — NA SULFATE-K SULFATE-MG SULF 17.5-3.13-1.6 GM/177ML PO SOLN: 1.0000 | Freq: Once | ORAL | 0 refills | Status: AC

## 2017-12-06 MED ORDER — OMEPRAZOLE 40 MG PO CPDR: 40.0000 mg | DELAYED_RELEASE_CAPSULE | Freq: Every day | ORAL | 3 refills | Status: DC

## 2017-12-06 NOTE — Progress Notes (Addendum)
BRENDY FICEK    [id number]    Jun 02, 1965  Primary Care Physician:Wharton, Myrtha Mantis  Referring Physician: No referring provider defined for this encounter.  Chief complaint: Nausea HPI: Nausea started 4 to 5 months ago. Nausea on and off, she has episodes once every few weeks and she can go for 3 to 4 weeks with no symptoms.  No identifiable trigger but is worse when she wakes up in the morning or when she tilts her head.  She was treated with Zofran 8 mg tablets and she has taken 30 tablets in the past 4 to 5 months with good control of symptom but the nausea comes back if she does not take Zofran.  She recently got separated and is under tremendous stress, had some medication changes.  She has been tapered off Lexapro and Celexa and is currently on Wellbutrin.  She has also gained about 20 to 30 pounds in the past few months.  Her eating habits have also changed.  Denies any heartburn, regurgitation, abdominal pain, change in bowel habits, constipation, diarrhea, melena or rectal bleeding.  No double vision, headaches or dizziness.  She was also treated with meclizine with no significant improvement She takes NSAIDs infrequently, ibuprofen once every 2 to 3 weeks   Colonoscopy by Dr. Sharlett Iles September 02, 2012 with removal of 2 small sessile polyps [tubular adenomas].   Outpatient Encounter Medications as of 12/06/2017  Medication Sig  . B Complex Vitamins (B COMPLEX PO) Take 1 tablet by mouth daily.  Marland Kitchen BIOTIN PO Take 1 tablet by mouth daily.   Marland Kitchen buPROPion (WELLBUTRIN) 100 MG tablet Take 100 mg by mouth daily.  . cetirizine (ZYRTEC) 10 MG tablet Take 10 mg by mouth daily.  . Cholecalciferol (VITAMIN D PO) Take 1 tablet by mouth daily.   . citalopram (CELEXA) 20 MG tablet TAKE 1 TABLET(20 MG) BY MOUTH DAILY  . Coenzyme Q10 (CO Q 10 PO) Take 1 tablet by mouth daily.  Marland Kitchen estradiol (ESTRACE) 1 MG tablet Take 1 tablet (1 mg total) by mouth daily.  . Flaxseed, Linseed,  (FLAXSEED OIL PO) Take 1 tablet by mouth daily.   Marland Kitchen ibuprofen (ADVIL,MOTRIN) 400 MG tablet Take 400 mg by mouth every 6 (six) hours as needed.  . Multiple Vitamins-Minerals (MULTIVITAMIN PO) Take 1 tablet by mouth daily.  . Probiotic Product (PROBIOTIC PO) Take 1 tablet by mouth daily.   Marland Kitchen tretinoin (RETIN-A) 0.05 % cream Apply 1 application topically 3 (three) times a week.  . valACYclovir (VALTREX) 1000 MG tablet Take 1 tablet by mouth as needed.   . [DISCONTINUED] escitalopram (LEXAPRO) 10 MG tablet Take 1 tablet (10 mg total) by mouth daily.   No facility-administered encounter medications on file as of 12/06/2017.     Allergies as of 12/06/2017  . (No Known Allergies)    Past Medical History:  Diagnosis Date  . Abnormal Pap smear    +HPV  . Anxiety   . Colon polyps   . Depression   . Heart murmur    asymptomatic  . Infertility, female   . PONV (postoperative nausea and vomiting)   . Seasonal allergies   . STD (sexually transmitted disease)    HSV1    Past Surgical History:  Procedure Laterality Date  . COLONOSCOPY  2008, 2014   polyps removed  . COLPOSCOPY    . CRYOTHERAPY    . CYSTOSCOPY N/A 09/23/2015   Procedure: CYSTOSCOPY;  Surgeon:  Megan Salon, MD;  Location: [facility];  Service: Gynecology;  Laterality: N/A;  . DERMOID CYST  EXCISION  left   6cm  . ECTOPIC PREGNANCY SURGERY  2010   right salpingectomy, partial left ovary removal  . LAPAROSCOPIC BILATERAL SALPINGECTOMY Bilateral 09/23/2015   Procedure: LAPAROSCOPIC LEFT SALPINGECTOMY;  Surgeon: Megan Salon, MD;  Location: [facility];  Service: Gynecology;  Laterality: Bilateral;  . LAPAROSCOPIC HYSTERECTOMY N/A 09/23/2015   Procedure: HYSTERECTOMY TOTAL LAPAROSCOPIC;  Surgeon: Megan Salon, MD;  Location: [facility];  Service: Gynecology;  Laterality: N/A;  . MANDIBLE SURGERY  1992    Family History  Problem Relation Age of Onset  . Colon polyps Mother   . Osteoporosis Mother   . Heart disease Father   . Heart  attack Father   . Heart disease Sister        heart stent  . Stroke Maternal Grandmother   . Hypertension Maternal Grandmother   . Heart disease Paternal Uncle        all had heart disease and passed of MI  . Colon cancer Neg Hx   . Esophageal cancer Neg Hx   . Rectal cancer Neg Hx   . Stomach cancer Neg Hx     Social History   Socioeconomic History  . Marital status: Married    Spouse name: Not on file  . Number of children: 0  . Years of education: Not on file  . Highest education level: Not on file  Occupational History  . Occupation: Social research officer, government  Social Needs  . Financial resource strain: Not on file  . Food insecurity:    Worry: Not on file    Inability: Not on file  . Transportation needs:    Medical: Not on file    Non-medical: Not on file  Tobacco Use  . Smoking status: Never Smoker  . Smokeless tobacco: Never Used  Substance and Sexual Activity  . Alcohol use: Yes    Alcohol/week: 2.0 standard drinks    Types: 2 Standard drinks or equivalent per week    Comment: occasional 2 per week  . Drug use: No  . Sexual activity: Yes    Partners: Male    Birth control/protection: None, Surgical    Comment: Hysterectomy  Lifestyle  . Physical activity:    Days per week: Not on file    Minutes per session: Not on file  . Stress: Not on file  Relationships  . Social connections:    Talks on phone: Not on file    Gets together: Not on file    Attends religious service: Not on file    Active member of club or organization: Not on file    Attends meetings of clubs or organizations: Not on file    Relationship status: Not on file  . Intimate partner violence:    Fear of current or ex partner: Not on file    Emotionally abused: Not on file    Physically abused: Not on file    Forced sexual activity: Not on file  Other Topics Concern  . Not on file  Social History Narrative  . Not on file      Review of systems: Review of Systems    Constitutional: Negative for fever and chills.  HENT: Positive for sinus trouble Eyes: Negative for blurred vision.  Respiratory: Negative for cough, shortness of breath and wheezing.   Cardiovascular: Negative for chest pain and palpitations.  Gastrointestinal: as per HPI  Genitourinary: Negative for dysuria, urgency, frequency and hematuria.  Musculoskeletal: Negative for myalgias, back pain and joint pain.  Skin: Negative for itching and rash.  Neurological: Negative for dizziness, tremors, focal weakness, seizures and loss of consciousness.  Endo/Heme/Allergies: Positive for seasonal allergies.  Psychiatric/Behavioral: Negative for  suicidal ideas and hallucinations.  Positive for anxiety and depression All other systems reviewed and are negative.   Physical Exam: Vitals:   12/06/17 1408  BP: 116/70  Pulse: 84   Body mass index is 24.58 kg/m. Gen:      No acute distress HEENT:  EOMI, sclera anicteric Neck:     No masses; no thyromegaly Lungs:    Clear to auscultation bilaterally; normal respiratory effort CV:         Regular rate and rhythm; no murmurs Abd:      + bowel sounds; soft, non-tender; no palpable masses, no distension Ext:    No edema; adequate peripheral perfusion Skin:      Warm and dry; no rash Neuro: alert and oriented x 3 Psych: normal mood and affect  Data Reviewed:  Reviewed labs, radiology imaging, old records and pertinent past GI work up   Assessment and Plan/Recommendations:  52 year old female here with complaints of intermittent nausea for past 4 to 5 months Unclear etiology for nausea but will need to exclude uncontrolled GERD or gallbladder disease peptic ulcer disease.  Recent medication changes likely exacerbating her symptoms Start omeprazole 40 mg daily, 30 minutes before breakfast Antireflux measures and lifestyle modification Schedule for EGD to evaluate Due for surveillance colonoscopy due to history of colon adenomatous polyps,  schedule colonoscopy along with EGD The risks and benefits as well as alternatives of endoscopic procedure(s) have been discussed and reviewed. All questions answered. The patient agrees to proceed.   Damaris Hippo , MD [phone]    CC: No ref. provider found

## 2017-12-06 NOTE — Patient Instructions (Addendum)
You have been scheduled for an endoscopy and colonoscopy. Please follow the written instructions given to you at your visit today. Please pick up your prep supplies at the pharmacy within the next 1-3 days. If you use inhalers (even only as needed), please bring them with you on the day of your procedure.   You have been scheduled for an abdominal ultrasound at [facility] (1st floor of hospital) on 12/09/2017 at 9:30am. Please arrive 15 minutes prior to your appointment for registration. Make certain not to have anything to eat or drink after midnight prior to your appointment. Should you need to reschedule your appointment, please contact radiology at [phone]. This test typically takes about 30 minutes to perform.  We have sent omeprazole to your pharmacy   Gastroesophageal Reflux Disease, Adult Normally, food travels down the esophagus and stays in the stomach to be digested. However, when a person has gastroesophageal reflux disease (GERD), food and stomach acid move back up into the esophagus. When this happens, the esophagus becomes sore and inflamed. Over time, GERD can create small holes (ulcers) in the lining of the esophagus. What are the causes? This condition is caused by a problem with the muscle between the esophagus and the stomach (lower esophageal sphincter, or LES). Normally, the LES muscle closes after food passes through the esophagus to the stomach. When the LES is weakened or abnormal, it does not close properly, and that allows food and stomach acid to go back up into the esophagus. The LES can be weakened by certain dietary substances, medicines, and medical conditions, including:  Tobacco use.  Pregnancy.  Having a hiatal hernia.  Heavy alcohol use.  Certain foods and beverages, such as coffee, chocolate, onions, and peppermint.  What increases the risk? This condition is more likely to develop in:  People who have an increased body weight.  People  who have connective tissue disorders.  People who use NSAID medicines.  What are the signs or symptoms? Symptoms of this condition include:  Heartburn.  Difficult or painful swallowing.  The feeling of having a lump in the throat.  Abitter taste in the mouth.  Bad breath.  Having a large amount of saliva.  Having an upset or bloated stomach.  Belching.  Chest pain.  Shortness of breath or wheezing.  Ongoing (chronic) cough or a night-time cough.  Wearing away of tooth enamel.  Weight loss.  Different conditions can cause chest pain. Make sure to see your health care provider if you experience chest pain. How is this diagnosed? Your health care provider will take a medical history and perform a physical exam. To determine if you have mild or severe GERD, your health care provider may also monitor how you respond to treatment. You may also have other tests, including:  An endoscopy toexamine your stomach and esophagus with a small camera.  A test thatmeasures the acidity level in your esophagus.  A test thatmeasures how much pressure is on your esophagus.  A barium swallow or modified barium swallow to show the shape, size, and functioning of your esophagus.  How is this treated? The goal of treatment is to help relieve your symptoms and to prevent complications. Treatment for this condition may vary depending on how severe your symptoms are. Your health care provider may recommend:  Changes to your diet.  Medicine.  Surgery.  Follow these instructions at home: Diet  Follow a diet as recommended by your health care provider. This may involve avoiding foods  and drinks such as: ? Coffee and tea (with or without caffeine). ? Drinks that containalcohol. ? Energy drinks and sports drinks. ? Carbonated drinks or sodas. ? Chocolate and cocoa. ? Peppermint and mint flavorings. ? Garlic and onions. ? Horseradish. ? Spicy and acidic foods, including peppers,  chili powder, curry powder, vinegar, hot sauces, and barbecue sauce. ? Citrus fruit juices and citrus fruits, such as oranges, lemons, and limes. ? Tomato-based foods, such as red sauce, chili, salsa, and pizza with red sauce. ? Fried and fatty foods, such as donuts, french fries, potato chips, and high-fat dressings. ? High-fat meats, such as hot dogs and fatty cuts of red and white meats, such as rib eye steak, sausage, ham, and bacon. ? High-fat dairy items, such as whole milk, butter, and cream cheese.  Eat small, frequent meals instead of large meals.  Avoid drinking large amounts of liquid with your meals.  Avoid eating meals during the 2-3 hours before bedtime.  Avoid lying down right after you eat.  Do not exercise right after you eat. General instructions  Pay attention to any changes in your symptoms.  Take over-the-counter and prescription medicines only as told by your health care provider. Do not take aspirin, ibuprofen, or other NSAIDs unless your health care provider told you to do so.  Do not use any tobacco products, including cigarettes, chewing tobacco, and e-cigarettes. If you need help quitting, ask your health care provider.  Wear loose-fitting clothing. Do not wear anything tight around your waist that causes pressure on your abdomen.  Raise (elevate) the head of your bed 6 inches (15cm).  Try to reduce your stress, such as with yoga or meditation. If you need help reducing stress, ask your health care provider.  If you are overweight, reduce your weight to an amount that is healthy for you. Ask your health care provider for guidance about a safe weight loss goal.  Keep all follow-up visits as told by your health care provider. This is important. Contact a health care provider if:  You have new symptoms.  You have unexplained weight loss.  You have difficulty swallowing, or it hurts to swallow.  You have wheezing or a persistent cough.  Your symptoms  do not improve with treatment.  You have a hoarse voice. Get help right away if:  You have pain in your arms, neck, jaw, teeth, or back.  You feel sweaty, dizzy, or light-headed.  You have chest pain or shortness of breath.  You vomit and your vomit looks like blood or coffee grounds.  You faint.  Your stool is bloody or black.  You cannot swallow, drink, or eat. This information is not intended to replace advice given to you by your health care provider. Make sure you discuss any questions you have with your health care provider. Document Released: 10/29/2004 Document Revised: 06/19/2015 Document Reviewed: 05/16/2014 Elsevier Interactive Patient Education  2018 [employer] for Gastroesophageal Reflux Disease, Adult When you have gastroesophageal reflux disease (GERD), the foods you eat and your eating habits are very important. Choosing the right foods can help ease your discomfort. What guidelines do I need to follow?  Choose fruits, vegetables, whole grains, and low-fat dairy products.  Choose low-fat meat, fish, and poultry.  Limit fats such as oils, salad dressings, butter, nuts, and avocado.  Keep a food diary. This helps you identify foods that cause symptoms.  Avoid foods that cause symptoms. These may be different for  everyone.  Eat small meals often instead of 3 large meals a day.  Eat your meals slowly, in a place where you are relaxed.  Limit fried foods.  Cook foods using methods other than frying.  Avoid drinking alcohol.  Avoid drinking large amounts of liquids with your meals.  Avoid bending over or lying down until 2-3 hours after eating. What foods are not recommended? These are some foods and drinks that may make your symptoms worse: Vegetables Tomatoes. Tomato juice. Tomato and spaghetti sauce. Chili peppers. Onion and garlic. Horseradish. Fruits Oranges, grapefruit, and lemon (fruit and juice). Meats High-fat meats,  fish, and poultry. This includes hot dogs, ribs, ham, sausage, salami, and bacon. Dairy Whole milk and chocolate milk. Sour cream. Cream. Butter. Ice cream. Cream cheese. Drinks Coffee and tea. Bubbly (carbonated) drinks or energy drinks. Condiments Hot sauce. Barbecue sauce. Sweets/Desserts Chocolate and cocoa. Donuts. Peppermint and spearmint. Fats and Oils High-fat foods. This includes [place] fries and potato chips. Other Vinegar. Strong spices. This includes black pepper, white pepper, red pepper, cayenne, curry powder, cloves, ginger, and chili powder. The items listed above may not be a complete list of foods and drinks to avoid. Contact your dietitian for more information. This information is not intended to replace advice given to you by your health care provider. Make sure you discuss any questions you have with your health care provider. Document Released: 07/21/2011 Document Revised: 06/27/2015 Document Reviewed: 11/23/2012 Elsevier Interactive Patient Education  2017 [employer].

## 2017-12-09 ENCOUNTER — Ambulatory Visit (HOSPITAL_COMMUNITY): Payer: 59

## 2017-12-27 ENCOUNTER — Other Ambulatory Visit: Payer: Self-pay | Admitting: Obstetrics & Gynecology

## 2017-12-27 NOTE — Telephone Encounter (Signed)
Medication refill request: Estradiol  Last AEX:  10-26-16 SM  Next AEX: 01-11-18  Last MMG (if hormonal medication request): 12-09-16 density C/BIRADS 2 benign  Refill authorized: 10-26-16 #[id number], 4RF. Today, please advise.   Medication pended for #30 as patient has aex appointment on 01-11-18.

## 2018-01-07 ENCOUNTER — Encounter: Payer: Self-pay | Admitting: Gastroenterology

## 2018-01-07 ENCOUNTER — Ambulatory Visit (AMBULATORY_SURGERY_CENTER): Payer: 59 | Admitting: Gastroenterology

## 2018-01-07 ENCOUNTER — Other Ambulatory Visit: Payer: Self-pay

## 2018-01-07 VITALS — BP 151/73 | HR 64 | Temp 97.8°F | Resp 12 | Ht 67.0 in | Wt 158.0 lb

## 2018-01-07 DIAGNOSIS — Z1211 Encounter for screening for malignant neoplasm of colon: Secondary | ICD-10-CM

## 2018-01-07 DIAGNOSIS — D125 Benign neoplasm of sigmoid colon: Secondary | ICD-10-CM

## 2018-01-07 DIAGNOSIS — D122 Benign neoplasm of ascending colon: Secondary | ICD-10-CM

## 2018-01-07 DIAGNOSIS — K30 Functional dyspepsia: Secondary | ICD-10-CM

## 2018-01-07 DIAGNOSIS — K635 Polyp of colon: Secondary | ICD-10-CM | POA: Diagnosis not present

## 2018-01-07 DIAGNOSIS — R110 Nausea: Secondary | ICD-10-CM | POA: Diagnosis not present

## 2018-01-07 DIAGNOSIS — D126 Benign neoplasm of colon, unspecified: Secondary | ICD-10-CM

## 2018-01-07 DIAGNOSIS — Z86010 Personal history of colonic polyps: Secondary | ICD-10-CM

## 2018-01-07 MED ORDER — SODIUM CHLORIDE 0.9 % IV SOLN: 500.0000 mL | Freq: Once | INTRAVENOUS | Status: DC

## 2018-01-07 NOTE — Progress Notes (Signed)
PT taken to PACU. Monitors in place. VSS. Report given to RN. 

## 2018-01-07 NOTE — Op Note (Signed)
[facility] Patient Name: Holly Hampton Procedure Date: 01/07/2018 3:05 PM MRN: [id number] Endoscopist: Mauri Pole , MD Age: 52 Referring MD:  Date of Birth: [birth year] Gender: Female Account #: [id number] Procedure:                Colonoscopy Indications:              Screening for colorectal malignant neoplasm Medicines:                Monitored Anesthesia Care Procedure:                Pre-Anesthesia Assessment:                           - Prior to the procedure, a History and Physical                            was performed, and patient medications and                            allergies were reviewed. The patient's tolerance of                            previous anesthesia was also reviewed. The risks                            and benefits of the procedure and the sedation                            options and risks were discussed with the patient.                            All questions were answered, and informed consent                            was obtained. Prior Anticoagulants: The patient has                            taken no previous anticoagulant or antiplatelet                            agents. ASA Grade Assessment: II - A patient with                            mild systemic disease. After reviewing the risks                            and benefits, the patient was deemed in                            satisfactory condition to undergo the procedure.                           After obtaining informed consent, the colonoscope  was passed under direct vision. Throughout the                            procedure, the patient's blood pressure, pulse, and                            oxygen saturations were monitored continuously. The                            Colonoscope was introduced through the anus and                            advanced to the the cecum, identified by                            appendiceal orifice and  ileocecal valve. The                            colonoscopy was performed without difficulty. The                            patient tolerated the procedure well. The quality                            of the bowel preparation was excellent. The                            ileocecal valve, appendiceal orifice, and rectum                            were photographed. Scope In: 3:13:03 PM Scope Out: 3:29:48 PM Scope Withdrawal Time: 0 hours 12 minutes 38 seconds  Total Procedure Duration: 0 hours 16 minutes 45 seconds  Findings:                 The perianal and digital rectal examinations were                            normal.                           A 1 mm polyp was found in the ascending colon. The                            polyp was sessile. The polyp was removed with a                            cold biopsy forceps. Resection and retrieval were                            complete.                           A 7 mm polyp was found in the sigmoid colon. The  polyp was sessile. The polyp was removed with a                            cold snare. Resection and retrieval were complete.                           A few small-mouthed diverticula were found in the                            sigmoid colon, ascending colon and cecum.                           Non-bleeding internal hemorrhoids were found during                            retroflexion. The hemorrhoids were small. Complications:            No immediate complications. Estimated Blood Loss:     Estimated blood loss was minimal. Impression:               - One 1 mm polyp in the ascending colon, removed                            with a cold biopsy forceps. Resected and retrieved.                           - One 7 mm polyp in the sigmoid colon, removed with                            a cold snare. Resected and retrieved.                           - Diverticulosis in the sigmoid colon.                            - Non-bleeding internal hemorrhoids. Recommendation:           - Patient has a contact number available for                            emergencies. The signs and symptoms of potential                            delayed complications were discussed with the                            patient. Return to normal activities tomorrow.                            Written discharge instructions were provided to the                            patient.                           -  Resume previous diet.                           - Continue present medications.                           - Await pathology results.                           - Repeat colonoscopy in 5-10 years for surveillance                            based on pathology results. Mauri Pole, MD 01/07/2018 3:38:05 PM This report has been signed electronically.

## 2018-01-07 NOTE — Progress Notes (Signed)
Called to room to assist during endoscopic procedure.  Patient ID and intended procedure confirmed with present staff. Received instructions for my participation in the procedure from the performing physician.  

## 2018-01-07 NOTE — Patient Instructions (Signed)
Handouts given : Polyps, Diverticulosis and Hemorrhoids   YOU HAD AN ENDOSCOPIC PROCEDURE TODAY AT [facility]:   Refer to the procedure report that was given to you for any specific questions about what was found during the examination.  If the procedure report does not answer your questions, please call your gastroenterologist to clarify.  If you requested that your care partner not be given the details of your procedure findings, then the procedure report has been included in a sealed envelope for you to review at your convenience later.  YOU SHOULD EXPECT: Some feelings of bloating in the abdomen. Passage of more gas than usual.  Walking can help get rid of the air that was put into your GI tract during the procedure and reduce the bloating. If you had a lower endoscopy (such as a colonoscopy or flexible sigmoidoscopy) you may notice spotting of blood in your stool or on the toilet paper. If you underwent a bowel prep for your procedure, you may not have a normal bowel movement for a few days.  Please Note:  You might notice some irritation and congestion in your nose or some drainage.  This is from the oxygen used during your procedure.  There is no need for concern and it should clear up in a day or so.  SYMPTOMS TO REPORT IMMEDIATELY:   Following lower endoscopy (colonoscopy or flexible sigmoidoscopy):  Excessive amounts of blood in the stool  Significant tenderness or worsening of abdominal pains  Swelling of the abdomen that is new, acute  Fever of 100F or higher   Following upper endoscopy (EGD)  Vomiting of blood or coffee ground material  New chest pain or pain under the shoulder blades  Painful or persistently difficult swallowing  New shortness of breath  Fever of 100F or higher  Black, tarry-looking stools  For urgent or emergent issues, a gastroenterologist can be reached at any hour by calling [phone].   DIET:  We do recommend a small meal at  first, but then you may proceed to your regular diet.  Drink plenty of fluids but you should avoid alcoholic beverages for 24 hours.  ACTIVITY:  You should plan to take it easy for the rest of today and you should NOT DRIVE or use heavy machinery until tomorrow (because of the sedation medicines used during the test).    FOLLOW UP: Our staff will call the number listed on your records the next business day following your procedure to check on you and address any questions or concerns that you may have regarding the information given to you following your procedure. If we do not reach you, we will leave a message.  However, if you are feeling well and you are not experiencing any problems, there is no need to return our call.  We will assume that you have returned to your regular daily activities without incident.  If any biopsies were taken you will be contacted by phone or by letter within the next 1-3 weeks.  Please call us at [phone] if you have not heard about the biopsies in 3 weeks.    SIGNATURES/CONFIDENTIALITY: You and/or your care partner have signed paperwork which will be entered into your electronic medical record.  These signatures attest to the fact that that the information above on your After Visit Summary has been reviewed and is understood.  Full responsibility of the confidentiality of this discharge information lies with you and/or your care-partner.

## 2018-01-07 NOTE — Op Note (Signed)
[facility] Patient Name: Holly Hampton Procedure Date: 01/07/2018 3:03 PM MRN: [id number] Endoscopist: Mauri Pole , MD Age: 52 Referring MD:  Date of Birth: 06-03-1965 Gender: Female Account #: [id number] Procedure:                Upper GI endoscopy Indications:              Dyspepsia, Nausea Medicines:                Monitored Anesthesia Care Procedure:                Pre-Anesthesia Assessment:                           - Prior to the procedure, a History and Physical                            was performed, and patient medications and                            allergies were reviewed. The patient's tolerance of                            previous anesthesia was also reviewed. The risks                            and benefits of the procedure and the sedation                            options and risks were discussed with the patient.                            All questions were answered, and informed consent                            was obtained. Prior Anticoagulants: The patient has                            taken no previous anticoagulant or antiplatelet                            agents. ASA Grade Assessment: II - A patient with                            mild systemic disease. After reviewing the risks                            and benefits, the patient was deemed in                            satisfactory condition to undergo the procedure.                           After obtaining informed consent, the endoscope was  passed under direct vision. Throughout the                            procedure, the patient's blood pressure, pulse, and                            oxygen saturations were monitored continuously. The                            Endoscope was introduced through the mouth, and                            advanced to the second part of duodenum. The upper                            GI endoscopy was accomplished  without difficulty.                            The patient tolerated the procedure well. Scope In: Scope Out: Findings:                 The examined esophagus was normal.                           The Z-line was regular and was found 38 cm from the                            incisors.                           The entire examined stomach was normal. Biopsies                            were taken with a cold forceps for Helicobacter                            pylori testing using CLOtest.                           The examined duodenum was normal. Complications:            No immediate complications. Estimated Blood Loss:     Estimated blood loss was minimal. Impression:               - Normal esophagus.                           - Z-line regular, 38 cm from the incisors.                           - Normal stomach. Biopsied.                           - Normal examined duodenum. Recommendation:           - Patient has a contact number available for  emergencies. The signs and symptoms of potential                            delayed complications were discussed with the                            patient. Return to normal activities tomorrow.                            Written discharge instructions were provided to the                            patient.                           - Resume previous diet.                           - Continue present medications.                           - Await pathology results. Mauri Pole, MD 01/07/2018 3:33:46 PM This report has been signed electronically.

## 2018-01-10 ENCOUNTER — Telehealth: Payer: Self-pay | Admitting: *Deleted

## 2018-01-10 LAB — HELICOBACTER PYLORI SCREEN-BIOPSY: UREASE: NEGATIVE

## 2018-01-10 NOTE — Telephone Encounter (Signed)
First follow up call attempt.  Message left to call if any questions or concerns. 

## 2018-01-11 ENCOUNTER — Other Ambulatory Visit: Payer: Self-pay

## 2018-01-11 ENCOUNTER — Encounter: Payer: Self-pay | Admitting: Obstetrics & Gynecology

## 2018-01-11 ENCOUNTER — Encounter: Payer: Self-pay | Admitting: Gastroenterology

## 2018-01-11 ENCOUNTER — Ambulatory Visit: Payer: 59 | Admitting: Obstetrics & Gynecology

## 2018-01-11 VITALS — BP 124/72 | HR 68 | Resp 16 | Ht 67.5 in | Wt 157.0 lb

## 2018-01-11 DIAGNOSIS — Z01419 Encounter for gynecological examination (general) (routine) without abnormal findings: Secondary | ICD-10-CM

## 2018-01-11 MED ORDER — VALACYCLOVIR HCL 1 G PO TABS: ORAL_TABLET | ORAL | 1 refills | Status: DC

## 2018-01-11 MED ORDER — ESTRADIOL 1 MG PO TABS: 1.0000 mg | ORAL_TABLET | Freq: Every day | ORAL | 4 refills | Status: DC
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
## 2018-01-11 NOTE — Patient Instructions (Signed)
Recombinant Zoster (Shingles) Vaccine, RZV: What You Need to Know    1. Why get vaccinated?  Shingles (also called herpes zoster, or just zoster) is a painful skin rash, often with blisters. Shingles is caused by the varicella zoster virus, the same virus that causes chickenpox. After you have chickenpox, the virus stays in your body and can cause shingles later in life.  You can't catch shingles from another person. However, a person who has never had chickenpox (or chickenpox vaccine) could get chickenpox from someone with shingles.  A shingles rash usually appears on one side of the face or body and heals within 2 to 4 weeks. Its main symptom is pain, which can be severe. Other symptoms can include fever, headache, chills and upset stomach. Very rarely, a shingles infection can lead to pneumonia, hearing problems, blindness, brain inflammation (encephalitis), or death.  For about 1 person in 5, severe pain can continue even long after the rash has cleared up. This long-lasting pain is called post-herpetic neuralgia (PHN).  Shingles is far more common in people 50 years of age and older than in younger people, and the risk increases with age. It is also more common in people whose immune system is weakened because of a disease such as cancer, or by drugs such as steroids or chemotherapy.  At least 1 million people a year in the [place] get shingles.    2. Shingles vaccine (recombinant)  Recombinant shingles vaccine was approved by FDA in 2017 for the prevention of shingles. In clinical trials, it was more than 90% effective in preventing shingles. It can also reduce the likelihood of PHN.  Two doses, 2 to 6 months apart, are recommended for adults 50 and older.  This vaccine is also recommended for people who have already gotten the live shingles vaccine (Zostavax). There is no live virus in this vaccine.    3. Some people should not get this vaccine  Tell your vaccine provider if you:   Have any  severe, life-threatening allergies. A person who has ever had a life-threatening allergic reaction after a dose of recombinant shingles vaccine, or has a severe allergy to any component of this vaccine, may be advised not to be vaccinated. Ask your health care provider if you want information about vaccine components.   Are pregnant or breastfeeding. There is not much information about use of recombinant shingles vaccine in pregnant or nursing women. Your healthcare provider might recommend delaying vaccination.   Are not feeling well. If you have a mild illness, such as a cold, you can probably get the vaccine today. If you are moderately or severely ill, you should probably wait until you recover. Your doctor can advise you.    4. Risks of a vaccine reaction  With any medicine, including vaccines, there is a chance of reactions.  After recombinant shingles vaccination, a person might experience:   Pain, redness, soreness, or swelling at the site of the injection   Headache, muscle aches, fever, shivering, fatigue    In clinical trials, most people got a sore arm with mild or moderate pain after vaccination, and some also had redness and swelling where they got the shot. Some people felt tired, had muscle pain, a headache, shivering, fever, stomach pain, or nausea. About 1 out of 6 people who got recombinant zoster vaccine experienced side effects that prevented them from doing regular activities. Symptoms went away on their own in about 2 to 3 days. Side effects were more   common in younger people.  You should still get the second dose of recombinant zoster vaccine even if you had one of these reactions after the first dose.    Other things that could happen after this vaccine:   People sometimes faint after medical procedures, including vaccination. Sitting or lying down for about 15 minutes can help prevent fainting and injuries caused by a fall. Tell your provider if you feel dizzy or have vision changes or  ringing in the ears.   Some people get shoulder pain that can be more severe and longer-lasting than routine soreness that can follow injections. This happens very rarely.   Any medication can cause a severe allergic reaction. Such reactions to a vaccine are estimated at about 1 in a million doses, and would happen within a few minutes to a few hours after the vaccination.  As with any medicine, there is a very remote chance of a vaccine causing a serious injury or death.  The safety of vaccines is always being monitored. For more information, visit: www.cdc.gov/vaccinesafety/    5. What if there is a serious problem?  What should I look for?   Look for anything that concerns you, such as signs of a severe allergic reaction, very high fever, or unusual behavior.  Signs of a severe allergic reaction can include hives, swelling of the face and throat, difficulty breathing, a fast heartbeat, dizziness, and weakness. These would usually start a few minutes to a few hours after the vaccination.    What should I do?   If you think it is a severe allergic reaction or other emergency that can't wait, call 9-1-1 and get to the nearest hospital. Otherwise, call your health care provider.  Afterward, the reaction should be reported to the Vaccine Adverse Event Reporting System (VAERS). Your doctor should file this report, or you can do it yourself through the VAERS web site atwww.vaers.hhs.govor by calling 1-800-822-7967.  VAERS does not give medical advice.    6. How can I learn more?   Ask your healthcare provider. He or she can give you the vaccine package insert or suggest other sources of information.   Call your local or state health department.   Contact the Centers for Disease Control and Prevention (CDC):  ? Call 1-800-232-4636 (1-800-CDC-INFO) or  ? Visit the CDC's website at www.cdc.gov/vaccines  ?   CDC Vaccine Information Statement (VIS) Recombinant Zoster Vaccine (03/16/2016)  This information is not  intended to replace advice given to you by your health care provider. Make sure you discuss any questions you have with your health care provider.  Document Released: 03/31/2016 Document Revised: 03/31/2016 Document Reviewed: 03/31/2016  Elsevier Interactive Patient Education  2018 [employer]

## 2018-01-12 ENCOUNTER — Ambulatory Visit (HOSPITAL_COMMUNITY)
Admission: RE | Admit: 2018-01-12 | Discharge: 2018-01-12 | Disposition: A | Discharge status: Home / Self Care | Payer: 59 | Source: Ambulatory Visit | Attending: Gastroenterology | Admitting: Gastroenterology

## 2018-01-12 ENCOUNTER — Encounter: Payer: Self-pay | Admitting: Gastroenterology

## 2018-01-12 DIAGNOSIS — R110 Nausea: Secondary | ICD-10-CM

## 2018-01-12 DIAGNOSIS — K219 Gastro-esophageal reflux disease without esophagitis: Secondary | ICD-10-CM

## 2018-01-12 DIAGNOSIS — D126 Benign neoplasm of colon, unspecified: Secondary | ICD-10-CM

## 2018-01-12 DIAGNOSIS — R1084 Generalized abdominal pain: Secondary | ICD-10-CM

## 2018-01-13 ENCOUNTER — Ambulatory Visit (HOSPITAL_COMMUNITY)
Admission: RE | Admit: 2018-01-13 | Discharge: 2018-01-13 | Disposition: A | Discharge status: Home / Self Care | Payer: 59 | Source: Ambulatory Visit | Attending: Gastroenterology | Admitting: Gastroenterology

## 2018-01-13 DIAGNOSIS — K219 Gastro-esophageal reflux disease without esophagitis: Secondary | ICD-10-CM | POA: Diagnosis not present

## 2018-01-13 DIAGNOSIS — D126 Benign neoplasm of colon, unspecified: Secondary | ICD-10-CM | POA: Insufficient documentation

## 2018-01-13 DIAGNOSIS — R110 Nausea: Secondary | ICD-10-CM | POA: Insufficient documentation

## 2018-01-13 DIAGNOSIS — R1084 Generalized abdominal pain: Secondary | ICD-10-CM | POA: Diagnosis not present

## 2018-03-01 ENCOUNTER — Encounter: Payer: Self-pay | Admitting: Certified Nurse Midwife

## 2019-01-22 ENCOUNTER — Other Ambulatory Visit: Payer: Self-pay | Admitting: Obstetrics & Gynecology

## 2019-01-23 NOTE — Telephone Encounter (Signed)
Medication refill request: estradiol 1 mg tab  Last AEX:  01/11/18  Next AEX: 05/23/18 Last MMG (if hormonal medication request): 01/18/18  Bi-rads 1 neg  Refill authorized: #90 with 1 RF

## 2019-04-24 ENCOUNTER — Encounter: Payer: Self-pay | Admitting: Certified Nurse Midwife

## 2019-05-22 ENCOUNTER — Other Ambulatory Visit: Payer: Self-pay

## 2019-05-23 ENCOUNTER — Ambulatory Visit: Payer: 59 | Admitting: Obstetrics & Gynecology

## 2019-05-23 ENCOUNTER — Encounter: Payer: Self-pay | Admitting: Obstetrics & Gynecology

## 2019-05-23 ENCOUNTER — Other Ambulatory Visit: Payer: Self-pay

## 2019-05-23 ENCOUNTER — Other Ambulatory Visit (HOSPITAL_COMMUNITY)
Admission: RE | Admit: 2019-05-23 | Discharge: 2019-05-23 | Disposition: A | Discharge status: Home / Self Care | Payer: 59 | Source: Ambulatory Visit | Attending: Obstetrics & Gynecology | Admitting: Obstetrics & Gynecology

## 2019-05-23 VITALS — BP 132/70 | HR 66 | Temp 97.0°F | Resp 16 | Ht 67.5 in | Wt 165.0 lb

## 2019-05-23 DIAGNOSIS — Z01419 Encounter for gynecological examination (general) (routine) without abnormal findings: Secondary | ICD-10-CM

## 2019-05-23 DIAGNOSIS — Z124 Encounter for screening for malignant neoplasm of cervix: Secondary | ICD-10-CM | POA: Insufficient documentation

## 2019-05-23 DIAGNOSIS — B977 Papillomavirus as the cause of diseases classified elsewhere: Secondary | ICD-10-CM | POA: Insufficient documentation

## 2019-05-23 DIAGNOSIS — Z0000 Encounter for general adult medical examination without abnormal findings: Secondary | ICD-10-CM

## 2019-05-23 MED ORDER — ESTRADIOL 1 MG PO TABS: 1.0000 mg | ORAL_TABLET | Freq: Every day | ORAL | 4 refills | Status: DC

## 2019-05-23 MED ORDER — VALACYCLOVIR HCL 1 G PO TABS: ORAL_TABLET | ORAL | 1 refills | Status: DC

## 2019-05-23 NOTE — Progress Notes (Signed)
54 y.o. G41P0010 Married White or Caucasian female here for annual exam.  Moved into new home.  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  Husband and pt amicably separated their assets.  She had Covid in January.  She had a minimal infection.  She's been vaccinated for Covid.  Finished last week.  Frustrated with weight.    Denies vaginal bleeding.    Patient's last menstrual period was 08/31/2015.          Sexually active: Yes.    The current method of family planning is status post hysterectomy.    Exercising: No.  walking and some running Smoker:  no  Health Maintenance: Pap: 10/26/16 Neg,  09/14/14 Neg   History of abnormal Pap:  Hx of ASCUS pap 2015 MMG: This year with [facility]--normal per pt.--called for report Colonoscopy: 01-07-18 polyps;next 10 years--pt. Has hx of polyps on colonoscopy. BMD:   n/a TDaP:  2015 Pneumonia vaccine(s):  n/a Shingrix: Oct and Dec 2020 Hep C testing: 08-02-15 Neg HIV Screening Labs: orders placed   reports that she has never smoked. She has never used smokeless tobacco. She reports current alcohol use of about 2.0 standard drinks of alcohol per week. She reports that she does not use drugs.  Past Medical History:  Diagnosis Date  . Abnormal Pap smear    +HPV  . Anxiety   . Colon polyps   . Depression   . Heart murmur    asymptomatic  . Infertility, female   . PONV (postoperative nausea and vomiting)   . Seasonal allergies   . STD (sexually transmitted disease)    HSV1    Past Surgical History:  Procedure Laterality Date  . COLONOSCOPY  2008, 2014   polyps removed  . COLPOSCOPY    . CRYOTHERAPY    . CYSTOSCOPY N/A 09/23/2015   Procedure: CYSTOSCOPY;  Surgeon: Megan Salon, MD;  Location: [facility];  Service: Gynecology;  Laterality: N/A;  . DERMOID CYST  EXCISION  left   6cm  . ECTOPIC PREGNANCY SURGERY  2010   right salpingectomy, partial left ovary removal  . LAPAROSCOPIC BILATERAL SALPINGECTOMY Bilateral 09/23/2015    Procedure: LAPAROSCOPIC LEFT SALPINGECTOMY;  Surgeon: Megan Salon, MD;  Location: [facility];  Service: Gynecology;  Laterality: Bilateral;  . LAPAROSCOPIC HYSTERECTOMY N/A 09/23/2015   Procedure: HYSTERECTOMY TOTAL LAPAROSCOPIC;  Surgeon: Megan Salon, MD;  Location: [facility];  Service: Gynecology;  Laterality: N/A;  . MANDIBLE SURGERY  1992    Current Outpatient Medications  Medication Sig Dispense Refill  . B Complex Vitamins (B COMPLEX PO) Take 1 tablet by mouth daily.    . cetirizine (ZYRTEC) 10 MG tablet Take 10 mg by mouth daily.    . Cholecalciferol (VITAMIN D PO) Take 1 tablet by mouth daily.     Marland Kitchen doxycycline (VIBRA-TABS) 100 MG tablet Take 100 mg by mouth daily.    Marland Kitchen estradiol (ESTRACE) 1 MG tablet TAKE 1 TABLET BY MOUTH EVERY DAY 90 tablet 1  . ibuprofen (ADVIL,MOTRIN) 400 MG tablet Take 400 mg by mouth every 6 (six) hours as needed.    . Multiple Vitamins-Minerals (MULTIVITAMIN PO) Take 1 tablet by mouth daily.    . ondansetron (ZOFRAN-ODT) 8 MG disintegrating tablet ondansetron 8 mg disintegrating tablet  DISSOLVE ONE TABLET BY MOUTH EVERY 8 HOURS DISSOLVED ORALLY UP TO THREE TIMES DAILY AS NEEDED FOR NAUSEA/V    . Probiotic Product (PROBIOTIC PO) Take 1 tablet by mouth daily.     Marland Kitchen  tretinoin (RETIN-A) 0.05 % cream Apply 1 application topically 3 (three) times a week.    . valACYclovir (VALTREX) 1000 MG tablet 2 tabs po x 1, repeat in 12 hours 30 tablet 1   No current facility-administered medications for this visit.    Family History  Problem Relation Age of Onset  . Colon polyps Mother   . Osteoporosis Mother   . Heart disease Father   . Heart attack Father   . Heart disease Sister        heart stent  . Stroke Maternal Grandmother   . Hypertension Maternal Grandmother   . Heart disease Paternal Uncle        all had heart disease and passed of MI  . Colon cancer Neg Hx   . Esophageal cancer Neg Hx   . Rectal cancer Neg Hx   . Stomach cancer Neg Hx     Review of  Systems  All other systems reviewed and are negative.   Exam:   BP 132/70   Pulse 66   Temp (!) 97 F (36.1 C) (Temporal)   Resp 16   Ht 5' 7.5" (1.715 m)   Wt 165 lb (74.8 kg)   LMP 08/31/2015   BMI 25.46 kg/m     Height: 5' 7.5" (171.5 cm)  Ht Readings from Last 3 Encounters:  05/23/19 5' 7.5" (1.715 m)  01/11/18 5' 7.5" (1.715 m)  01/07/18 5\' 7"  (1.702 m)   General appearance: alert, cooperative and appears stated age Head: Normocephalic, without obvious abnormality, atraumatic Neck: no adenopathy, supple, symmetrical, trachea midline and thyroid normal to inspection and palpation Lungs: clear to auscultation bilaterally Breasts: normal appearance, no masses or tenderness Heart: regular rate and rhythm Abdomen: soft, non-tender; bowel sounds normal; no masses,  no organomegaly Extremities: extremities normal, atraumatic, no cyanosis or edema Skin: Skin color, texture, turgor normal. No rashes or lesions Lymph nodes: Cervical, supraclavicular, and axillary nodes normal. No abnormal inguinal nodes palpated Neurologic: Grossly normal   Pelvic: External genitalia:  no lesions              Urethra:  normal appearing urethra with no masses, tenderness or lesions              Bartholins and Skenes: normal                 Vagina: normal appearing vagina with normal color and discharge, no lesions              Cervix: absent              Pap taken: No. Bimanual Exam:  Uterus:  uterus absent              Adnexa: no mass, fullness, tenderness               Rectovaginal: Confirms               Anus:  normal sphincter tone, no lesions  Chaperone, Terence Lux, CMA, was present for exam.  A:  Well Woman with normal exam PMP, on HRT H/o TLH/bilateral salpingectomy/cystoscopy 8/17 due to fibroids and bleeding Remote xh of cryo to cervix due to abnormal pap smears, h/o HR HPV H/o oral HSV 1  P:   Mammogram guidelines reviewed.  We called for copy today. pap smear with HR  HPV obtained today Return for fasting lab work.  Orders placed.   Will try to decrease estradiol to 0.5mg  daily.  Rx to pharmacy.  RF for Valtrex 2 gram po x 1, repeat in 12 hours.  #30/1RF. return annually or prn

## 2019-05-24 LAB — CYTOLOGY - PAP
Comment: NEGATIVE
Diagnosis: NEGATIVE
High risk HPV: NEGATIVE

## 2019-05-25 ENCOUNTER — Other Ambulatory Visit: Payer: 59

## 2019-05-26 ENCOUNTER — Other Ambulatory Visit: Payer: Self-pay

## 2019-05-26 ENCOUNTER — Other Ambulatory Visit (INDEPENDENT_AMBULATORY_CARE_PROVIDER_SITE_OTHER): Payer: 59

## 2019-05-26 DIAGNOSIS — Z0000 Encounter for general adult medical examination without abnormal findings: Secondary | ICD-10-CM

## 2019-05-29 LAB — CBC
Hematocrit: 39.6 % (ref 34.0–46.6)
Hemoglobin: 13.2 g/dL (ref 11.1–15.9)
MCH: 30.4 pg (ref 26.6–33.0)
MCHC: 33.3 g/dL (ref 31.5–35.7)
MCV: 91 fL (ref 79–97)
Platelets: 261 10*3/uL (ref 150–450)
RBC: 4.34 x10E6/uL (ref 3.77–5.28)
RDW: 12 % (ref 11.7–15.4)
WBC: 5 10*3/uL (ref 3.4–10.8)

## 2019-05-29 LAB — VITAMIN D 25 HYDROXY (VIT D DEFICIENCY, FRACTURES): Vit D, 25-Hydroxy: 31.9 ng/mL (ref 30.0–100.0)

## 2019-05-29 LAB — COMPREHENSIVE METABOLIC PANEL
ALT: 17 IU/L (ref 0–32)
AST: 19 IU/L (ref 0–40)
Albumin/Globulin Ratio: 1.6 (ref 1.2–2.2)
Albumin: 4.4 g/dL (ref 3.8–4.9)
Alkaline Phosphatase: 83 IU/L (ref 39–117)
BUN/Creatinine Ratio: 16 (ref 9–23)
BUN: 12 mg/dL (ref 6–24)
Bilirubin Total: 0.3 mg/dL (ref 0.0–1.2)
CO2: 25 mmol/L (ref 20–29)
Calcium: 8.8 mg/dL (ref 8.7–10.2)
Chloride: 107 mmol/L — ABNORMAL HIGH (ref 96–106)
Creatinine, Ser: 0.74 mg/dL (ref 0.57–1.00)
GFR calc Af Amer: 107 mL/min/{1.73_m2} (ref >59)
GFR calc non Af Amer: 93 mL/min/{1.73_m2} (ref >59)
Globulin, Total: 2.8 g/dL (ref 1.5–4.5)
Glucose: 99 mg/dL (ref 65–99)
Potassium: 4.4 mmol/L (ref 3.5–5.2)
Sodium: 142 mmol/L (ref 134–144)
Total Protein: 7.2 g/dL (ref 6.0–8.5)

## 2019-05-29 LAB — LIPID PANEL
Chol/HDL Ratio: 4.2 ratio (ref 0.0–4.4)
Cholesterol, Total: 221 mg/dL — ABNORMAL HIGH (ref 100–199)
HDL: 53 mg/dL (ref >39)
LDL Chol Calc (NIH): 141 mg/dL — ABNORMAL HIGH (ref 0–99)
Triglycerides: 150 mg/dL — ABNORMAL HIGH (ref 0–149)
VLDL Cholesterol Cal: 27 mg/dL (ref 5–40)

## 2019-05-29 LAB — TSH: TSH: 2.66 u[IU]/mL (ref 0.450–4.500)

## 2019-05-29 LAB — HEMOGLOBIN A1C
Est. average glucose Bld gHb Est-mCnc: 105 mg/dL
Hgb A1c MFr Bld: 5.3 % (ref 4.8–5.6)

## 2019-07-11 ENCOUNTER — Encounter: Payer: Self-pay | Admitting: Obstetrics & Gynecology

## 2020-04-29 ENCOUNTER — Encounter: Payer: Self-pay | Admitting: Obstetrics & Gynecology

## 2020-05-27 ENCOUNTER — Ambulatory Visit (INDEPENDENT_AMBULATORY_CARE_PROVIDER_SITE_OTHER): Payer: 59 | Admitting: Obstetrics & Gynecology

## 2020-05-27 ENCOUNTER — Other Ambulatory Visit: Payer: Self-pay

## 2020-05-27 ENCOUNTER — Encounter (HOSPITAL_BASED_OUTPATIENT_CLINIC_OR_DEPARTMENT_OTHER): Payer: Self-pay | Admitting: Obstetrics & Gynecology

## 2020-05-27 ENCOUNTER — Other Ambulatory Visit (HOSPITAL_BASED_OUTPATIENT_CLINIC_OR_DEPARTMENT_OTHER)
Admission: RE | Admit: 2020-05-27 | Discharge: 2020-05-27 | Disposition: A | Discharge status: Home / Self Care | Payer: 59 | Source: Ambulatory Visit | Attending: Obstetrics & Gynecology | Admitting: Obstetrics & Gynecology

## 2020-05-27 VITALS — BP 141/83 | HR 71 | Ht 67.5 in | Wt 170.0 lb

## 2020-05-27 DIAGNOSIS — E7800 Pure hypercholesterolemia, unspecified: Secondary | ICD-10-CM | POA: Insufficient documentation

## 2020-05-27 DIAGNOSIS — Z01419 Encounter for gynecological examination (general) (routine) without abnormal findings: Secondary | ICD-10-CM

## 2020-05-27 DIAGNOSIS — Z0000 Encounter for general adult medical examination without abnormal findings: Secondary | ICD-10-CM | POA: Diagnosis not present

## 2020-05-27 DIAGNOSIS — Z79890 Hormone replacement therapy (postmenopausal): Secondary | ICD-10-CM

## 2020-05-27 DIAGNOSIS — B009 Herpesviral infection, unspecified: Secondary | ICD-10-CM | POA: Diagnosis not present

## 2020-05-27 LAB — COMPREHENSIVE METABOLIC PANEL
ALT: 17 U/L (ref 0–44)
AST: 18 U/L (ref 15–41)
Albumin: 4.3 g/dL (ref 3.5–5.0)
Alkaline Phosphatase: 67 U/L (ref 38–126)
Anion gap: 4 — ABNORMAL LOW (ref 5–15)
BUN: 12 mg/dL (ref 6–20)
CO2: 29 mmol/L (ref 22–32)
Calcium: 9.2 mg/dL (ref 8.9–10.3)
Chloride: 106 mmol/L (ref 98–111)
Creatinine, Ser: 0.61 mg/dL (ref 0.44–1.00)
GFR, Estimated: >60 mL/min (ref >60)
Glucose, Bld: 109 mg/dL — ABNORMAL HIGH (ref 70–99)
Potassium: 4.3 mmol/L (ref 3.5–5.1)
Sodium: 139 mmol/L (ref 135–145)
Total Bilirubin: 0.4 mg/dL (ref 0.3–1.2)
Total Protein: 7.2 g/dL (ref 6.5–8.1)

## 2020-05-27 LAB — LIPID PANEL
Cholesterol: 229 mg/dL — ABNORMAL HIGH (ref 0–200)
HDL: 56 mg/dL (ref >40)
LDL Cholesterol: 148 mg/dL — ABNORMAL HIGH (ref 0–99)
Total CHOL/HDL Ratio: 4.1 RATIO
Triglycerides: 124 mg/dL (ref <150)
VLDL: 25 mg/dL (ref 0–40)

## 2020-05-27 LAB — HEMOGLOBIN A1C
Hgb A1c MFr Bld: 5.2 % (ref 4.8–5.6)
Mean Plasma Glucose: 102.54 mg/dL

## 2020-05-27 MED ORDER — ESTRADIOL 1 MG PO TABS: 1.0000 mg | ORAL_TABLET | Freq: Every day | ORAL | 4 refills | Status: DC

## 2020-05-27 MED ORDER — VALACYCLOVIR HCL 1 G PO TABS: ORAL_TABLET | ORAL | 1 refills | Status: DC

## 2020-05-27 NOTE — Progress Notes (Signed)
55 y.o. G41P0010 Married White or Caucasian female here for annual exam.  Having a lot of stressors with work.  Does have some anxiety with these stressors.  Has questions about Plenity.    Denies vaginal bleeding.  She is still with same person.  Patient's last menstrual period was 08/31/2015.          Sexually active: Yes.    The current method of family planning is status post hysterectomy.    Exercising: walking 2 times weekly Smoker:  no  Health Maintenance: Pap:  Neg with neg HR HPV 2021 History of abnormal Pap:  Remote hx of +HR HPV MMG:  04/12/2019 Colonoscopy:  01/07/2018, follow up 10 years BMD:   n/a TDaP:  12/15 Pneumonia vaccine(s):  n/a Shingrix:   completed Hep C testing: 11/14/2013 Screening Labs: will obtained today   reports that she has never smoked. She has never used smokeless tobacco. She reports current alcohol use of about 2.0 standard drinks of alcohol per week. She reports that she does not use drugs.  Past Medical History:  Diagnosis Date  . Abnormal Pap smear    +HPV  . Anxiety   . Colon polyps   . Depression   . Heart murmur    asymptomatic  . Infertility, female   . PONV (postoperative nausea and vomiting)   . Seasonal allergies   . STD (sexually transmitted disease)    HSV1    Past Surgical History:  Procedure Laterality Date  . COLONOSCOPY  2008, 2014   polyps removed  . COLPOSCOPY    . CRYOTHERAPY    . CYSTOSCOPY N/A 09/23/2015   Procedure: CYSTOSCOPY;  Surgeon: Megan Salon, MD;  Location: [facility];  Service: Gynecology;  Laterality: N/A;  . DERMOID CYST  EXCISION  left   6cm  . ECTOPIC PREGNANCY SURGERY  2010   right salpingectomy, partial left ovary removal  . LAPAROSCOPIC BILATERAL SALPINGECTOMY Bilateral 09/23/2015   Procedure: LAPAROSCOPIC LEFT SALPINGECTOMY;  Surgeon: Megan Salon, MD;  Location: [facility];  Service: Gynecology;  Laterality: Bilateral;  . LAPAROSCOPIC HYSTERECTOMY N/A 09/23/2015   Procedure: HYSTERECTOMY TOTAL  LAPAROSCOPIC;  Surgeon: Megan Salon, MD;  Location: [facility];  Service: Gynecology;  Laterality: N/A;  . MANDIBLE SURGERY  1992    Current Outpatient Medications  Medication Sig Dispense Refill  . B Complex Vitamins (B COMPLEX PO) Take 1 tablet by mouth daily.    . cetirizine (ZYRTEC) 10 MG tablet Take 10 mg by mouth daily.    . Cholecalciferol (VITAMIN D PO) Take 1 tablet by mouth daily.     Marland Kitchen doxycycline (VIBRA-TABS) 100 MG tablet Take 100 mg by mouth daily.    Marland Kitchen estradiol (ESTRACE) 1 MG tablet Take 1 tablet (1 mg total) by mouth daily. 90 tablet 4  . ibuprofen (ADVIL,MOTRIN) 400 MG tablet Take 400 mg by mouth every 6 (six) hours as needed.    . Multiple Vitamins-Minerals (MULTIVITAMIN PO) Take 1 tablet by mouth daily.    . ondansetron (ZOFRAN-ODT) 8 MG disintegrating tablet ondansetron 8 mg disintegrating tablet  DISSOLVE ONE TABLET BY MOUTH EVERY 8 HOURS DISSOLVED ORALLY UP TO THREE TIMES DAILY AS NEEDED FOR NAUSEA/V    . Probiotic Product (PROBIOTIC PO) Take 1 tablet by mouth daily.     Marland Kitchen tretinoin (RETIN-A) 0.05 % cream Apply 1 application topically 3 (three) times a week.    . valACYclovir (VALTREX) 1000 MG tablet 2 tabs po x 1, repeat in 12 hours 30  tablet 1   No current facility-administered medications for this visit.    Family History  Problem Relation Age of Onset  . Colon polyps Mother   . Osteoporosis Mother   . Heart disease Father   . Heart attack Father   . Heart disease Sister        heart stent  . Stroke Maternal Grandmother   . Hypertension Maternal Grandmother   . Heart disease Paternal Uncle        all had heart disease and passed of MI  . Colon cancer Neg Hx   . Esophageal cancer Neg Hx   . Rectal cancer Neg Hx   . Stomach cancer Neg Hx     Review of Systems  Psychiatric/Behavioral: The patient is nervous/anxious.     Exam:   BP (!) 141/83   Pulse 71   Ht 5' 7.5" (1.715 m)   Wt 170 lb (77.1 kg)   LMP 08/31/2015   BMI 26.23 kg/m   Height: 5'  7.5" (171.5 cm)  General appearance: alert, cooperative and appears stated age Head: Normocephalic, without obvious abnormality, atraumatic Neck: no adenopathy, supple, symmetrical, trachea midline and thyroid normal to inspection and palpation Lungs: clear to auscultation bilaterally Breasts: normal appearance, no masses or tenderness Heart: regular rate and rhythm Abdomen: soft, non-tender; bowel sounds normal; no masses,  no organomegaly Extremities: extremities normal, atraumatic, no cyanosis or edema Skin: Skin color, texture, turgor normal. No rashes or lesions Lymph nodes: Cervical, supraclavicular, and axillary nodes normal. No abnormal inguinal nodes palpated Neurologic: Grossly normal   Pelvic: External genitalia:  no lesions              Urethra:  normal appearing urethra with no masses, tenderness or lesions              Bartholins and Skenes: normal                 Vagina: normal appearing vagina with normal color and no discharge, no lesions              Cervix: absent              Pap taken: No. Bimanual Exam:  Uterus:  uterus absent              Adnexa: no mass, fullness, tenderness               Rectovaginal: Confirms               Anus:  normal sphincter tone, no lesions  Chaperone, Prince Rome, CMA, was present for exam.  Assessment/Plan: 1. Well woman exam with routine gynecological exam - pap neg with neg HR HPV 2021 - MMG done March.  Will get copy of this. - Colonoscopy up to date - BMD not due - vaccines up to date  2. 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 Hormone replacement therapy (HRT) - Continue estradiol 1.0mg  daily.  #90/4RF.

## 2020-06-04 ENCOUNTER — Telehealth (HOSPITAL_BASED_OUTPATIENT_CLINIC_OR_DEPARTMENT_OTHER): Payer: Self-pay

## 2020-06-04 NOTE — Telephone Encounter (Signed)
-----   Message from Mary S Miller, MD sent at 06/04/2020  6:52 AM EDT ----- Please call pt.  She has seen her results but I have a question for her.  Cholesterol was mildly elevated.  This does not need treatment.  Her glucose in the CMP was mildly elevated but HbA1C was normal so she does not have diabetes.  She is frustrated with her weight and consider Plenity.  I can prescribe this for her but it must go through a specialty pharmacy.  Does she want to try this.  I can prescribe and plan follow up if this is something she desires.  Thanks. 

## 2020-06-04 NOTE — Telephone Encounter (Signed)
-----   Message from Megan Salon, MD sent at 06/04/2020  6:52 AM EDT ----- Please call pt.  She has seen her results but I have a question for her.  Cholesterol was mildly elevated.  This does not need treatment.  Her glucose in the CMP was mildly elevated but HbA1C was normal so she does not have diabetes.  She is frustrated with her weight and consider Plenity.  I can prescribe this for her but it must go through a specialty pharmacy.  Does she want to try this.  I can prescribe and plan follow up if this is something she desires.  Thanks.

## 2020-06-04 NOTE — Telephone Encounter (Signed)
Patient called back and was relayed the message per Dr. Sabra Heck. Patient states that she would like to try the Plenity. She feels like if she could get a little jump start and lose about 20 pounds maybe some of her other issues will go away. tbw

## 2020-06-05 IMAGING — US US ABDOMEN COMPLETE
1 series · 14 of 25 positions shown · non-contrast
Comparison: None

CLINICAL DATA: Intermittent nausea x months

EXAM:
COMPLETE ABDOMINAL ULTRASOUND

[Series 2: us abdomen complete · 14 of 78 slices shown]
[im 1/78]
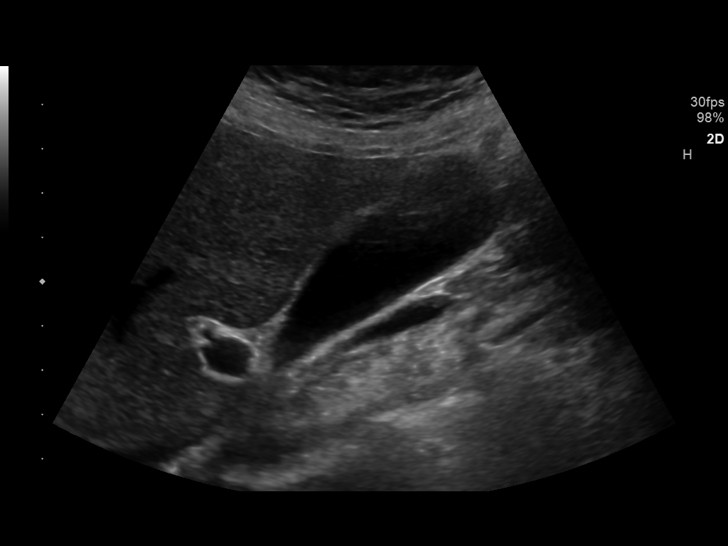
[im 7/78]
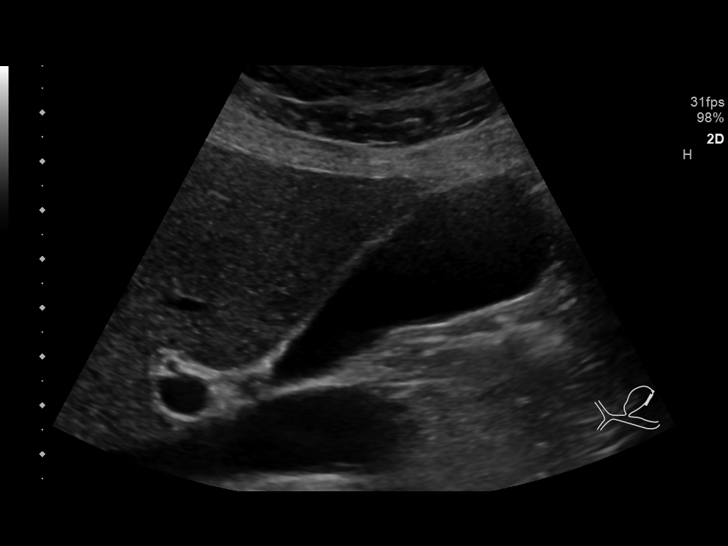
[im 13/78]
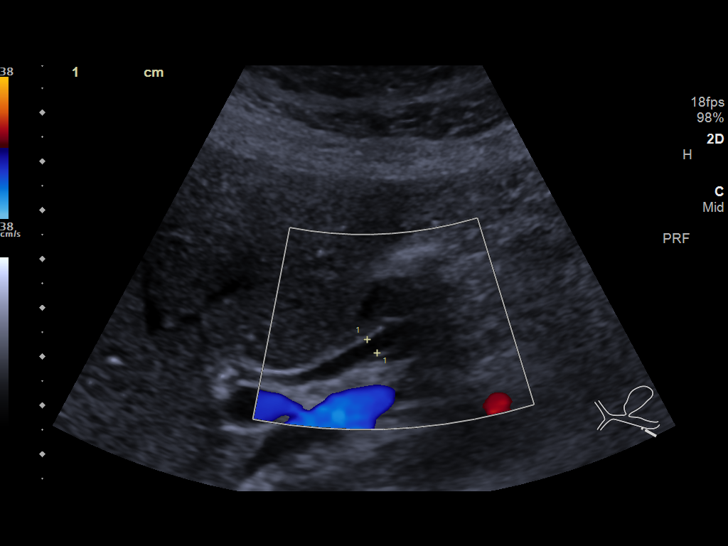
[im 20/78]
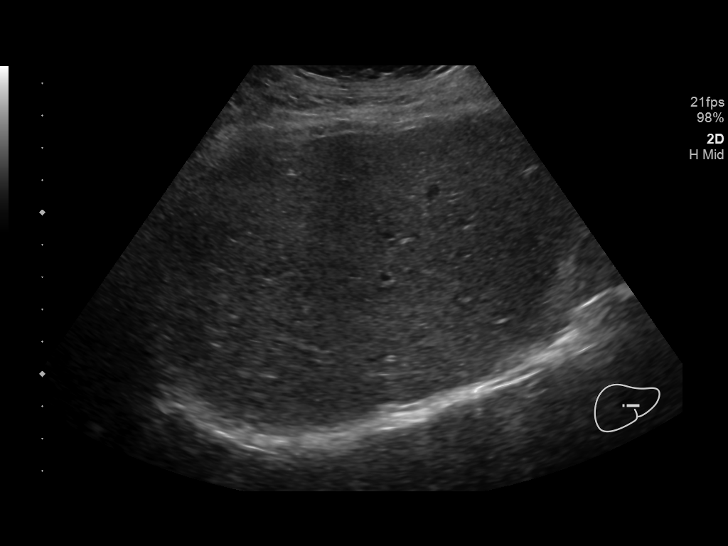
[im 26/78]
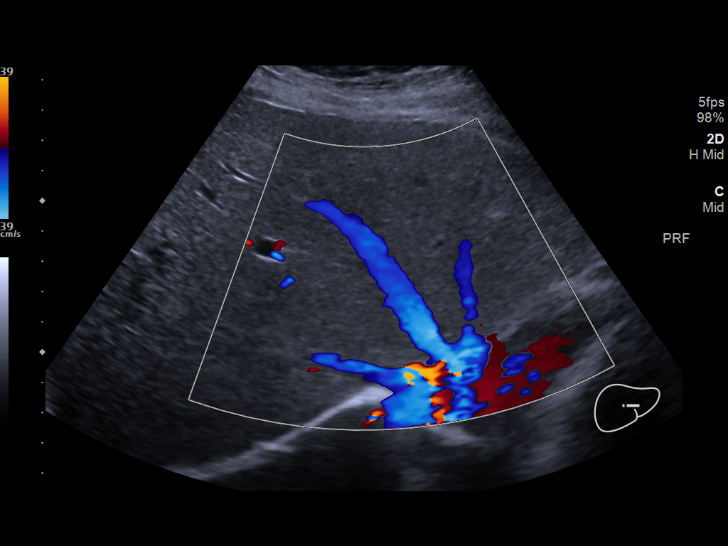
[im 29/78]
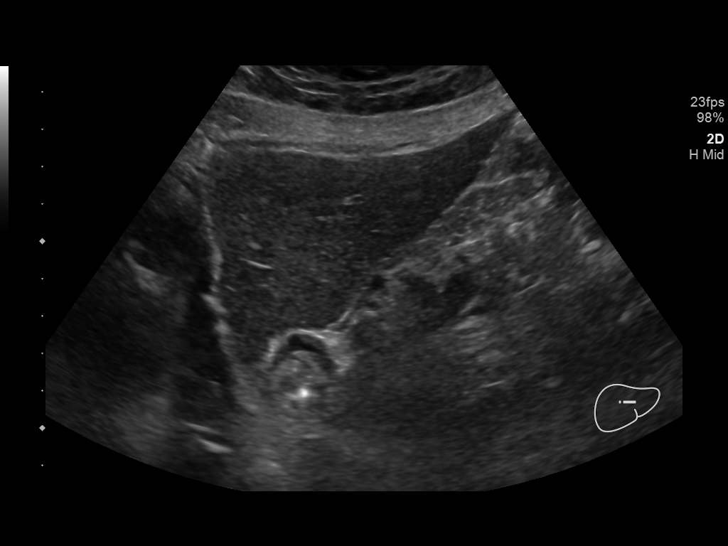
[im 36/78]
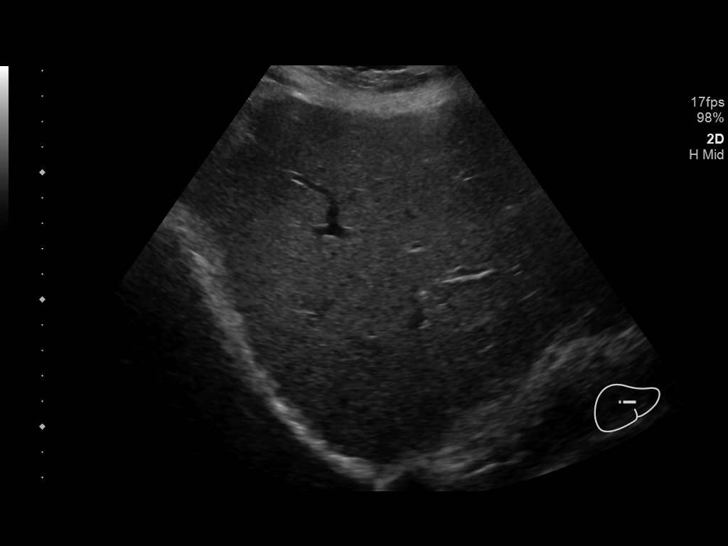
[im 42/78]
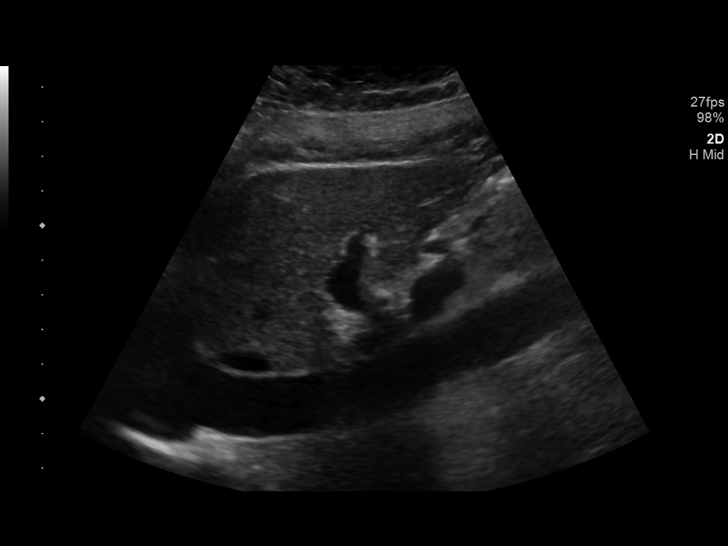
[im 49/78]
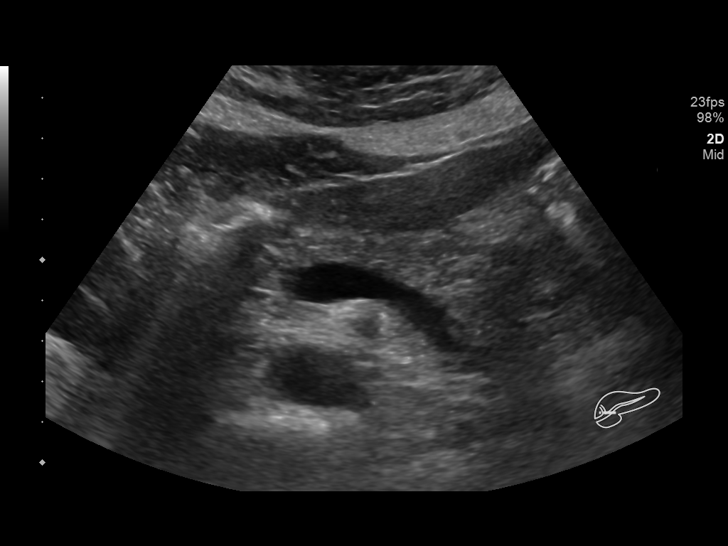
[im 52/78]
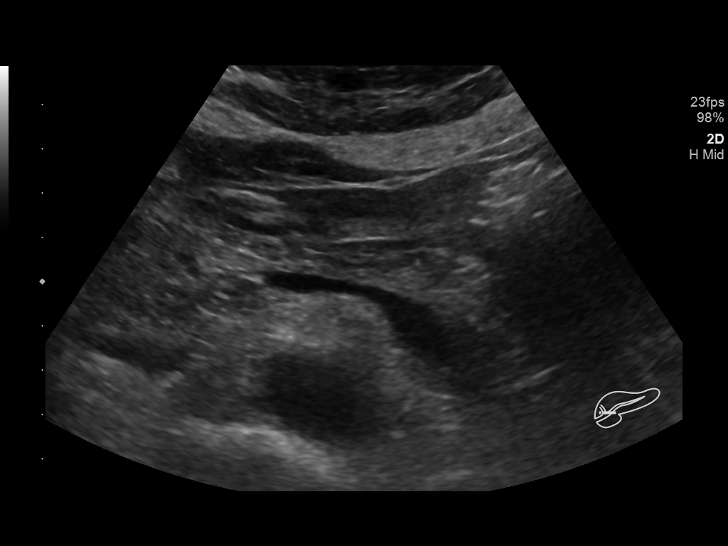
[im 58/78]
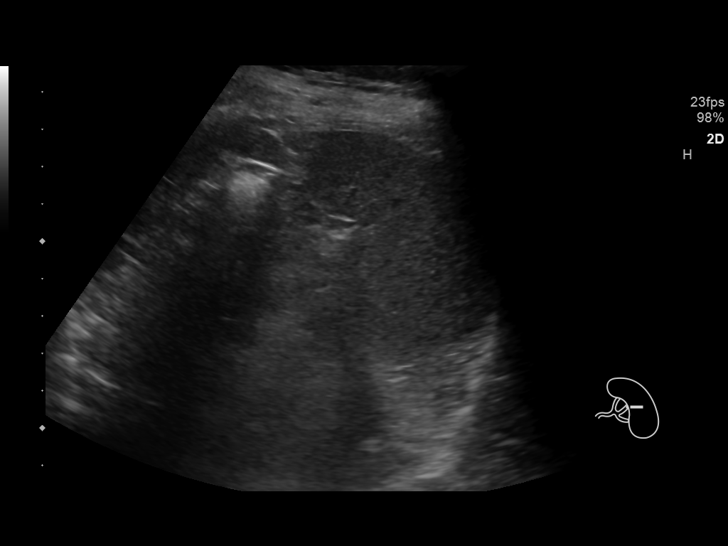
[im 65/78]
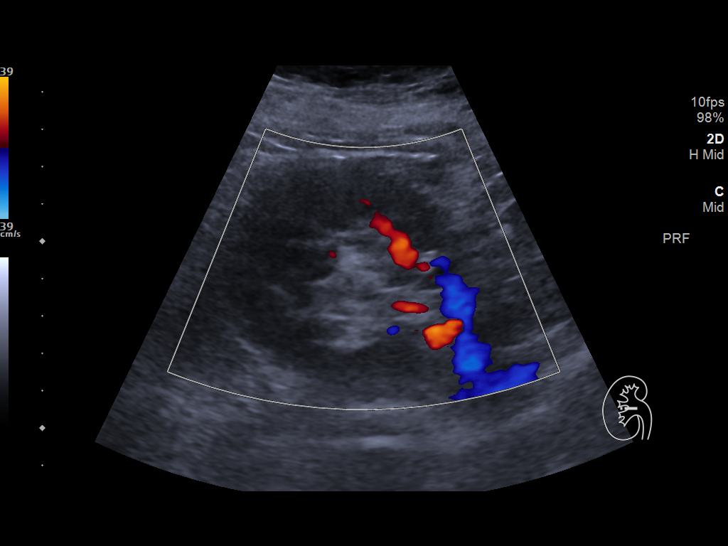
[im 71/78]
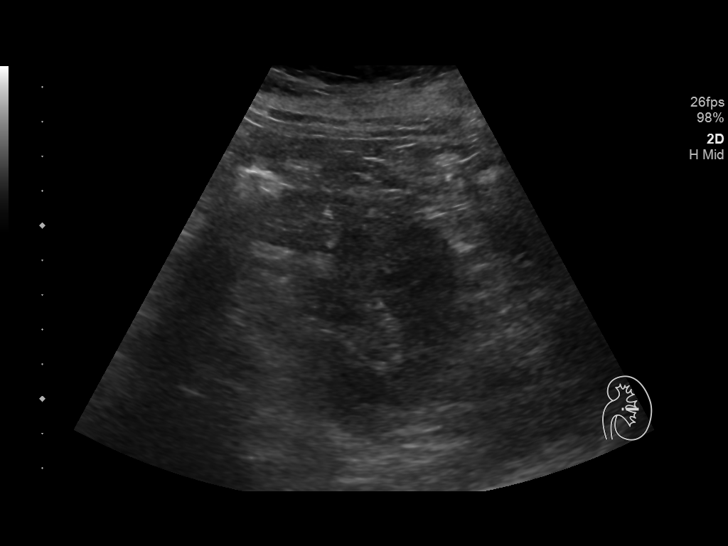
[im 78/78]
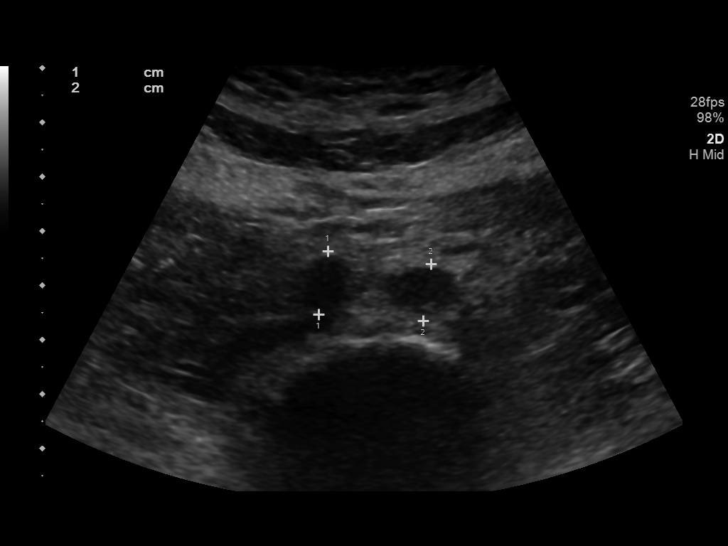

[14 of 25 positions shown; findings below may reference images not displayed]

FINDINGS: Gallbladder: Physiologically distended without stones, wall
thickening, or pericholecystic fluid. Sonographer reports no
sonographic Murphy's sign.

Common bile duct:  Normal in caliber, 3.4mm diameter.

Liver: Homogeneous in echotexture without focal lesion or
intrahepatic bile duct dilatation. Antegrade flow signal in the
portal vein on color Doppler.

IVC:  Negative

Pancreas: Visualized segments unremarkable, portions obscured by
overlying bowel gas.

Spleen:  No focal lesion, craniocaudal 5.1cm in length.

Right Kidney:  No mass or hydronephrosis, 10.6cm in length.

Left Kidney:  No lesion or hydronephrosis, 10.4cm in length.

Abdominal aorta:  Negative
IMPRESSION: Negative.  Normal gallbladder.

## 2020-07-15 ENCOUNTER — Ambulatory Visit: Payer: 59

## 2020-07-19 ENCOUNTER — Other Ambulatory Visit (HOSPITAL_BASED_OUTPATIENT_CLINIC_OR_DEPARTMENT_OTHER): Payer: Self-pay | Admitting: Obstetrics & Gynecology

## 2020-07-19 MED ORDER — PLENITY PO CAPS: 3.0000 | ORAL_CAPSULE | Freq: Two times a day (BID) | ORAL | 2 refills | Status: DC

## 2020-10-04 ENCOUNTER — Other Ambulatory Visit (HOSPITAL_BASED_OUTPATIENT_CLINIC_OR_DEPARTMENT_OTHER): Payer: Self-pay | Admitting: *Deleted

## 2020-10-04 MED ORDER — PLENITY PO CAPS: 3.0000 | ORAL_CAPSULE | Freq: Two times a day (BID) | ORAL | 2 refills | Status: DC

## 2020-11-11 ENCOUNTER — Other Ambulatory Visit: Payer: Self-pay

## 2020-11-11 ENCOUNTER — Encounter: Payer: Self-pay | Admitting: Internal Medicine

## 2020-11-11 ENCOUNTER — Ambulatory Visit: Payer: 59 | Admitting: Internal Medicine

## 2020-11-11 VITALS — BP 142/102 | HR 67 | Ht 67.0 in | Wt 167.0 lb

## 2020-11-11 DIAGNOSIS — I10 Essential (primary) hypertension: Secondary | ICD-10-CM | POA: Diagnosis not present

## 2020-11-11 DIAGNOSIS — E782 Mixed hyperlipidemia: Secondary | ICD-10-CM | POA: Insufficient documentation

## 2020-11-11 DIAGNOSIS — Z8249 Family history of ischemic heart disease and other diseases of the circulatory system: Secondary | ICD-10-CM

## 2020-11-11 NOTE — Patient Instructions (Signed)
Medication Instructions:  Your physician recommends that you continue on your current medications as directed. Please refer to the Current Medication list given to you today.  *If you need a refill on your cardiac medications before your next appointment, please call your pharmacy*   Lab Work: NONE If you have labs (blood work) drawn today and your tests are completely normal, you will receive your results only by: [place] (if you have MyChart) OR A paper copy in the mail If you have any lab test that is abnormal or we need to change your treatment, we will call you to review the results.   Testing/Procedures: Your physician has requested that you have a Coronary Calcium Score. This is a self pay test cost of $99 out of pocket.    Follow-Up: At [facility], you and your health needs are our priority.  As part of our continuing mission to provide you with exceptional heart care, we have created designated Provider Care Teams.  These Care Teams include your primary Cardiologist (physician) and Advanced Practice Providers (APPs -  Physician Assistants and Nurse Practitioners) who all work together to provide you with the care you need, when you need it.  Your next appointment:   3-4 month(s)  The format for your next appointment:   In Person  Provider:   You may see Werner Lean, MD or one of the following Advanced Practice Providers on your designated Care Team:   Melina Copa, PA-C Ermalinda Barrios, PA-C   Other Instructions Please check ambulatory BP and call in if BP is consistently higher than 135/85

## 2020-11-11 NOTE — Progress Notes (Signed)
Cardiology Office Note:    Date:  2020/12/04   Pronounced W I ller  ID:  Holly Hampton, DOB 1965/12/21, MRN [id number]  PCP:  Marda Stalker, PA-C   [facility] Providers Cardiologist:  Werner Lean, MD     Referring MD: Marda Stalker, PA-C   CC: Elevated ambulatory BP Consulted for the evaluation of [place] at the behest of Marda Stalker, [place]  History of Present Illness:    Holly Hampton is a 55 y.o. female with a hx of heart murmur NOS, HTN, HLD, FHX CAD. 2020/12/04 eval.  Patient notes that she is doing ok until her blood pressure began to get elevated.  Over the past two months have had headache, nausea, and elevated BP readings.  Established with a PCP and started lisinopril 10 mg-> 20 mg PO daily.  Was able to due half marathons in 2017; has had a few life events that has curtailed her activity.  No chest pain or pressure .  Notes a bit SOB during anxiety without DOE and no PND/Orthopnea.  No weight gain or leg swelling.  No palpitations or syncope but notes significant anxiety.  Was briefly on hormone replacement therapy.    Ambulatory blood pressure 140/90.  Past Medical History:  Diagnosis Date   Abnormal Pap smear    +HPV   Anxiety    Colon polyps    Depression    Heart murmur    asymptomatic   HTN (hypertension)    Infertility, female    PONV (postoperative nausea and vomiting)    Seasonal allergies    STD (sexually transmitted disease)    HSV1    Past Surgical History:  Procedure Laterality Date   COLONOSCOPY  2008, 2014   polyps removed   COLPOSCOPY     CRYOTHERAPY     CYSTOSCOPY N/A 09/23/2015   Procedure: CYSTOSCOPY;  Surgeon: Megan Salon, MD;  Location: [facility];  Service: Gynecology;  Laterality: N/A;   DERMOID CYST  EXCISION  left   6cm   ECTOPIC PREGNANCY SURGERY  2010   right salpingectomy, partial left ovary removal   LAPAROSCOPIC BILATERAL SALPINGECTOMY Bilateral 09/23/2015   Procedure: LAPAROSCOPIC LEFT SALPINGECTOMY;   Surgeon: Megan Salon, MD;  Location: [facility];  Service: Gynecology;  Laterality: Bilateral;   LAPAROSCOPIC HYSTERECTOMY N/A 09/23/2015   Procedure: HYSTERECTOMY TOTAL LAPAROSCOPIC;  Surgeon: Megan Salon, MD;  Location: [facility];  Service: Gynecology;  Laterality: N/A;   MANDIBLE SURGERY  1992    Current Medications: Current Meds  Medication Sig   Carboxymeth-Cellulose-CitricAc (PLENITY) CAPS Take 3 capsules by mouth 2 (two) times daily before lunch and supper.   cetirizine (ZYRTEC) 10 MG tablet Take 10 mg by mouth daily.   Cholecalciferol (VITAMIN D PO) Take 1 tablet by mouth daily.    estradiol (ESTRACE) 1 MG tablet Take 1 tablet (1 mg total) by mouth daily.   ibuprofen (ADVIL,MOTRIN) 400 MG tablet Take 400 mg by mouth every 6 (six) hours as needed.   Multiple Vitamins-Minerals (MULTIVITAMIN PO) Take 1 tablet by mouth daily.   ondansetron (ZOFRAN-ODT) 8 MG disintegrating tablet ondansetron 8 mg disintegrating tablet  DISSOLVE ONE TABLET BY MOUTH EVERY 8 HOURS DISSOLVED ORALLY UP TO THREE TIMES DAILY AS NEEDED FOR NAUSEA/V   Probiotic Product (PROBIOTIC PO) Take 1 tablet by mouth daily.    tretinoin (RETIN-A) 0.05 % cream Apply 1 application topically 3 (three) times a week.   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  Tobacco Use   Smoking status: Never   Smokeless tobacco: Never  Vaping Use   Vaping Use: Never used  Substance and Sexual Activity   Alcohol use: Yes    Alcohol/week: 2.0 standard drinks    Types: 2 Standard drinks or equivalent per week   Drug use: No   Sexual activity: Yes    Partners: Male    Birth control/protection: None, Surgical     Comment: Hysterectomy  Other Topics Concern   Not on file  Social History Narrative   Not on file   Social Determinants of Health   Financial Resource Strain: Not on file  Food Insecurity: Not on file  Transportation Needs: Not on file  Physical Activity: Not on file  Stress: Not on file  Social Connections: Not on file    Social: Went through menopause and notes a legal separation  Family History: The patient's family history includes CVA in her sister; Colon polyps in her mother; Coronary artery disease in her sister; Heart attack in her father; Heart disease in her father, paternal uncle, paternal uncle, and sister; Hypertension in her maternal grandmother; Osteoporosis in her mother; Stroke in her maternal grandmother. There is no history of Colon cancer, Esophageal cancer, Rectal cancer, or Stomach cancer. Father died of his second MI at age 23.  Multiple ischemia cardiomyopathies and MI's in males of family Oldest sister had recent stroke and has coronary artery disease  ROS:   Please see the history of present illness.    All other systems reviewed and are negative.  EKGs/Labs/Other Studies Reviewed:    The following studies were reviewed today:  EKG:  EKG is  ordered today.  The ekg ordered today demonstrates  11/11/20: SR rate 67 borderline criteria for anterior infarct  Recent Labs: 05/27/2020: ALT 17; BUN 12; Creatinine, Ser 0.61; Potassium 4.3; Sodium 139  Recent Lipid Panel    Component Value Date/Time   CHOL 229 (H) 05/27/2020 0927   CHOL 221 (H) 05/26/2019 0844   TRIG 124 05/27/2020 0927   HDL 56 05/27/2020 0927   HDL 53 05/26/2019 0844   CHOLHDL 4.1 05/27/2020 0927   VLDL 25 05/27/2020 0927   LDLCALC 148 (H) 05/27/2020 0927   LDLCALC 141 (H) 05/26/2019 0844    Physical Exam:    VS:  BP (!) 142/102 (BP Location: Right Arm, Patient Position: Sitting, Cuff Size: Normal)   Pulse 67   Ht 5\' 7"  (1.702 m)   Wt 167 lb (75.8 kg)   LMP 08/31/2015   SpO2  99%   BMI 26.16 kg/m     Wt Readings from Last 3 Encounters:  11/11/20 167 lb (75.8 kg)  05/27/20 170 lb (77.1 kg)  05/23/19 165 lb (74.8 kg)     GEN:  Well nourished, well developed in no acute distress HEENT: Normal NECK: No JVD;  LYMPHATICS: No lymphadenopathy CARDIAC: RRR, no murmurs, rubs, gallops RESPIRATORY:  Clear to auscultation without rales, wheezing or rhonchi  ABDOMEN: Soft, non-tender, non-distended MUSCULOSKELETAL:  mild non pitting edema bilateral; No deformity  SKIN: Warm and dry NEUROLOGIC:  Alert and oriented  x 3 PSYCHIATRIC:  Normal affect   ASSESSMENT:    1. Family history of early CAD   2. Essential hypertension   3. Mixed hyperlipidemia    PLAN:    Family history CAD HTN and HLD (3.7% ASCVD) - continue amb BP monitoring, if still doesn't come down in two weeks, will start HCTZ 12.5 mg PO daily while  continuing to reduce sodium and increase activity (and BMP in one week) - discussed meds and lifestyle and CAC; will do CAC and lifestyle changes for now  Will plan for 3-4 month follow up unless new symptoms or abnormal test results warranting change in plan  Would be reasonable for  APP Follow up    Medication Adjustments/Labs and Tests Ordered: Current medicines are reviewed at length with the patient today.  Concerns regarding medicines are outlined above.  Orders Placed This Encounter  Procedures   CT CARDIAC SCORING (SELF PAY ONLY)   EKG 12-Lead    No orders of the defined types were placed in this encounter.   Patient Instructions  Medication Instructions:  Your physician recommends that you continue on your current medications as directed. Please refer to the Current Medication list given to you today.  *If you need a refill on your cardiac medications before your next appointment, please call your pharmacy*   Lab Work: NONE If you have labs (blood work) drawn today and your tests are completely normal, you will receive your  results only by: [place] (if you have MyChart) OR A paper copy in the mail If you have any lab test that is abnormal or we need to change your treatment, we will call you to review the results.   Testing/Procedures: Your physician has requested that you have a Coronary Calcium Score. This is a self pay test cost of $99 out of pocket.    Follow-Up: At [facility], you and your health needs are our priority.  As part of our continuing mission to provide you with exceptional heart care, we have created designated Provider Care Teams.  These Care Teams include your primary Cardiologist (physician) and Advanced Practice Providers (APPs -  Physician Assistants and Nurse Practitioners) who all work together to provide you with the care you need, when you need it.  Your next appointment:   3-4 month(s)  The format for your next appointment:   In Person  Provider:   You may see Werner Lean, MD or one of the following Advanced Practice Providers on your designated Care Team:   Melina Copa, PA-C Ermalinda Barrios, PA-C   Other Instructions Please check ambulatory BP and call in if BP is consistently higher than 135/85    Signed, Werner Lean, MD  11/11/2020 3:36 PM    [place]

## 2020-12-09 ENCOUNTER — Other Ambulatory Visit: Payer: Self-pay

## 2020-12-09 ENCOUNTER — Ambulatory Visit (INDEPENDENT_AMBULATORY_CARE_PROVIDER_SITE_OTHER)
Admission: RE | Admit: 2020-12-09 | Discharge: 2020-12-09 | Disposition: A | Discharge status: Home / Self Care | Payer: Self-pay | Source: Ambulatory Visit | Attending: Internal Medicine | Admitting: Internal Medicine

## 2020-12-09 DIAGNOSIS — I10 Essential (primary) hypertension: Secondary | ICD-10-CM

## 2020-12-09 DIAGNOSIS — Z8249 Family history of ischemic heart disease and other diseases of the circulatory system: Secondary | ICD-10-CM

## 2020-12-09 DIAGNOSIS — E782 Mixed hyperlipidemia: Secondary | ICD-10-CM

## 2020-12-20 ENCOUNTER — Telehealth: Payer: Self-pay | Admitting: Internal Medicine

## 2020-12-20 DIAGNOSIS — E782 Mixed hyperlipidemia: Secondary | ICD-10-CM

## 2020-12-20 NOTE — Telephone Encounter (Signed)
Holly Hampton is calling for her CT results due to receiving a letter in regards to it.

## 2020-12-20 NOTE — Telephone Encounter (Signed)
Left a message for pt to call back for results.

## 2020-12-20 NOTE — Telephone Encounter (Signed)
Follow Up:   Patien is returning call, concerning her results.               P

## 2020-12-20 NOTE — Telephone Encounter (Signed)
-----   Message from Werner Lean, MD sent at 12/10/2020  8:13 PM EST ----- Results: Elevated LDL  Elevated CAC Plan: Rosuvastation 5 mg PO Daily and labs at follow up  Werner Lean, MD

## 2020-12-20 NOTE — Telephone Encounter (Signed)
Called pt reviewed results and MD recommendations.  Pt would like to have a FLP prior to starting medication.  Pt reports she has changed her diet and would like a baseline FLP.  I have scheduled pt to come in for lab work on 12/24/20; advised pt to fast 8-12 hours prior and lab is drop in anytime 7:30 am -4:30 pm.  She verbalizes understanding all questions answered.

## 2020-12-24 ENCOUNTER — Other Ambulatory Visit: Payer: Self-pay

## 2020-12-24 ENCOUNTER — Other Ambulatory Visit: Payer: 59 | Admitting: *Deleted

## 2020-12-24 DIAGNOSIS — E782 Mixed hyperlipidemia: Secondary | ICD-10-CM

## 2020-12-24 LAB — LIPID PANEL
Chol/HDL Ratio: 4.2 ratio (ref 0.0–4.4)
Cholesterol, Total: 186 mg/dL (ref 100–199)
HDL: 44 mg/dL (ref >39)
LDL Chol Calc (NIH): 115 mg/dL — ABNORMAL HIGH (ref 0–99)
Triglycerides: 153 mg/dL — ABNORMAL HIGH (ref 0–149)
VLDL Cholesterol Cal: 27 mg/dL (ref 5–40)

## 2020-12-25 MED ORDER — ROSUVASTATIN CALCIUM 5 MG PO TABS: 5.0000 mg | ORAL_TABLET | Freq: Every day | ORAL | 3 refills | Status: DC

## 2020-12-25 NOTE — Addendum Note (Signed)
Addended by: Precious Gilding on: 12/25/2020 11:00 AM   Modules accepted: Orders

## 2020-12-25 NOTE — Telephone Encounter (Signed)
Patient is following up. She states she is ready to begin taking cholesterol medication.   *STAT* If patient is at the pharmacy, call can be transferred to refill team.   1. Which medications need to be refilled? (please list name of each medication and dose if known)  Crestor 5 MG   2. Which pharmacy/location (including street and city if local pharmacy) is medication to be sent to? [facility], [place] - [address] AT [place]  3. Do they need a 30 day or 90 day supply?  30 day supply

## 2020-12-25 NOTE — Telephone Encounter (Signed)
Left a voicemail for pt to call the office.  I also sent pt a my chart message letting her know that rosuvastatin 5 mg PO QD is ordered for her to take.  Also asked that she send in a message letting me know when she would like to come in for 3 month f/u labs.

## 2020-12-25 NOTE — Telephone Encounter (Signed)
Lab appointment scheduled for 03/28/20.

## 2021-01-12 NOTE — Progress Notes (Signed)
Cardiology Office Note:    Date:  February 06, 2021   Pronounced W I ller  ID:  Holly Hampton, DOB 09-09-1965, MRN [id number]  PCP:  Marda Stalker, PA-C   [facility] Providers Cardiologist:  Werner Lean, MD     Referring MD: Marda Stalker, PA-C   CC:  Follow up BP, CAC  History of Present Illness:    Holly Hampton is a 55 y.o. female with a hx of heart murmur NOS, HTN, HLD, FHX CAD. 11/11/20 eval.  In interim of this visit, patient had new CAC and started statin.  Seen 01/12/21.  Patient notes that she is doing well.  Was disheartened   about the lack of efficacy of her diet and exercise in her changes in her cholesterol. Since day prior/last visit notes  has just started rosuvastatin without SE. There are no interval hospital/ED visit.    No chest pain or pressure .  No SOB/DOE and no PND/Orthopnea.  No weight gain or leg swelling.  No palpitations or syncope .  Ambulatory blood pressure 130/70 on lisinopril 20; logs reviewed.   Past Medical History:  Diagnosis Date   Abnormal Pap smear    +HPV   Anxiety    Colon polyps    Depression    Heart murmur    asymptomatic   HTN (hypertension)    Infertility, female    PONV (postoperative nausea and vomiting)    Seasonal allergies    STD (sexually transmitted disease)    HSV1    Past Surgical History:  Procedure Laterality Date   COLONOSCOPY  2008, 2014   polyps removed   COLPOSCOPY     CRYOTHERAPY     CYSTOSCOPY N/A 09/23/2015   Procedure: CYSTOSCOPY;  Surgeon: Megan Salon, MD;  Location: [facility];  Service: Gynecology;  Laterality: N/A;   DERMOID CYST  EXCISION  left   6cm   ECTOPIC PREGNANCY SURGERY  2010   right salpingectomy, partial left ovary removal   LAPAROSCOPIC BILATERAL SALPINGECTOMY Bilateral 09/23/2015   Procedure: LAPAROSCOPIC LEFT SALPINGECTOMY;  Surgeon: Megan Salon, MD;  Location: [facility];  Service: Gynecology;  Laterality: Bilateral;   LAPAROSCOPIC HYSTERECTOMY N/A 09/23/2015    Procedure: HYSTERECTOMY TOTAL LAPAROSCOPIC;  Surgeon: Megan Salon, MD;  Location: [facility];  Service: Gynecology;  Laterality: N/A;   MANDIBLE SURGERY  1992    Current Medications: Current Meds  Medication Sig   cetirizine (ZYRTEC) 10 MG tablet Take 10 mg by mouth daily.   Cholecalciferol (VITAMIN D PO) Take 1 tablet by mouth daily.    Coenzyme Q10 (COQ10) 50 MG CAPS Take by mouth daily.   estradiol (ESTRACE) 1 MG tablet Take 1 tablet (1 mg total) by mouth daily.   Flaxseed, Linseed, (FLAXSEED OIL) 1400 MG CAPS daily.   ibuprofen (ADVIL,MOTRIN) 400 MG tablet Take 400 mg by mouth every 6 (six) hours as needed.   lisinopril (ZESTRIL) 20 MG tablet Take 20 mg by mouth daily.   Multiple Vitamins-Minerals (MULTIVITAMIN PO) Take 1 tablet by mouth daily.   ondansetron (ZOFRAN-ODT) 8 MG disintegrating tablet ondansetron 8 mg disintegrating tablet  DISSOLVE ONE TABLET BY MOUTH EVERY 8 HOURS DISSOLVED ORALLY UP TO THREE TIMES DAILY AS NEEDED FOR NAUSEA/V   Probiotic Product (PROBIOTIC PO) Take 1 tablet by mouth daily.    rosuvastatin (CRESTOR) 5 MG tablet Take 1 tablet (5 mg total) by mouth daily.   tretinoin (RETIN-A) 0.05 % cream Apply 1 application topically 3 (three) times a week.  valACYclovir (VALTREX) 1000 MG tablet 2 tabs po x 1, repeat in 12 hours   [DISCONTINUED] Carboxymeth-Cellulose-CitricAc (PLENITY) CAPS Take 3 capsules by mouth 2 (two) times daily before lunch and supper.     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  Tobacco Use   Smoking status: Never   Smokeless tobacco: Never  Vaping Use   Vaping Use: Never used  Substance and Sexual Activity   Alcohol use: Yes    Alcohol/week: 2.0 standard drinks    Types: 2 Standard  drinks or equivalent per week   Drug use: No   Sexual activity: Yes    Partners: Male    Birth control/protection: None, Surgical    Comment: Hysterectomy  Other Topics Concern   Not on file  Social History Narrative   Not on file   Social Determinants of Health   Financial Resource Strain: Not on file  Food Insecurity: Not on file  Transportation Needs: Not on file  Physical Activity: Not on file  Stress: Not on file  Social Connections: Not on file    Social: Went through menopause and notes a legal separation  Family History: The patient's family history includes CVA in her sister; Colon polyps in her mother; Coronary artery disease in her sister; Heart attack in her father; Heart disease in her father, paternal uncle, paternal uncle, and sister; Hypertension in her maternal grandmother; Osteoporosis in her mother; Stroke in her maternal grandmother. There is no history of Colon cancer, Esophageal cancer, Rectal cancer, or Stomach cancer. Father died of his second MI at age 44.  Multiple ischemia cardiomyopathies and MI's in males of family Oldest sister had recent stroke and has coronary artery disease  ROS:   Please see the history of present illness.    All other systems reviewed and are negative.  EKGs/Labs/Other Studies Reviewed:    The following studies were reviewed today:  EKG:  EKG is  ordered today.  The ekg ordered today demonstrates  11/11/20: SR rate 67 borderline criteria for anterior infarct  CAC: Date: 12/24/20 Results: FINDINGS: Coronary arteries: Normal origins.   Coronary Calcium Score:   Left main: 0   Left anterior descending artery: 83   Left circumflex artery: 151   Right coronary artery: 5   Total: 239   Percentile: 98th   Pericardium: Normal.   Ascending Aorta: Normal caliber.  Recent Labs: 05/27/2020: ALT 17; BUN 12; Creatinine, Ser 0.61; Potassium 4.3; Sodium 139  Recent Lipid Panel    Component Value Date/Time   CHOL 186  12/24/2020 0804   TRIG 153 (H) 12/24/2020 0804   HDL 44 12/24/2020 0804   CHOLHDL 4.2 12/24/2020 0804   CHOLHDL 4.1 05/27/2020 0927   VLDL 25 05/27/2020 0927   LDLCALC 115 (H) 12/24/2020 0804    Physical Exam:    VS:  BP 120/60   Pulse 68   Ht 5' 7.5" (1.715 m)   Wt 160 lb (72.6 kg)   LMP 08/31/2015   SpO2 98%   BMI 24.69 kg/m     Wt Readings from Last 3 Encounters:  01/13/21 160 lb (72.6 kg)  11/11/20 167 lb (75.8 kg)  05/27/20 170 lb (77.1 kg)  Gen: No distress   Neck: No JVD Cardiac: No Rubs or Gallops, no Murmur, regular +2 radial pulses Respiratory: Clear to auscultation bilaterally, normal effort, normal  respiratory rate GI: Soft, nontender, non-distended  MS: No  edema;  moves all extremities Integument: Skin feels warm Neuro:  At time of evaluation, alert and oriented to person/place/time/situation  Psych: Normal affect, patient feels warm   ASSESSMENT:    1. Essential hypertension   2. Mixed hyperlipidemia   3. Family history of early CAD   32. Aortic atherosclerosis (HCC)     PLAN:    Aortic Atherosclerosis Mixed HLD CAC HTN FHX CAD - primary prevention may be reasonable, will discuss this in more depth when we see follow up labs - continue lisinopril 20 mg - we discussed a variety of interventions including diet, [place]luo, repeat CAC testing, chelation therapy and the data that exists for primary prevention - will start with ldl goal of 70, if tolerates will will trial goal of 55 - has lipids in February - see me in one year   Medication Adjustments/Labs and Tests Ordered: Current medicines are reviewed at length with the patient today.  Concerns regarding medicines are outlined above.  No orders of the defined types were placed in this encounter.   No orders of the defined types were placed in this encounter.   Patient Instructions  Medication Instructions:  Your physician recommends that you continue on your current medications  as directed. Please refer to the Current Medication list given to you today.  *If you need a refill on your cardiac medications before your next appointment, please call your pharmacy*   Lab Work: NONE If you have labs (blood work) drawn today and your tests are completely normal, you will receive your results only by: [place] (if you have MyChart) OR A paper copy in the mail If you have any lab test that is abnormal or we need to change your treatment, we will call you to review the results.   Testing/Procedures: NONE   Follow-Up: At [facility], you and your health needs are our priority.  As part of our continuing mission to provide you with exceptional heart care, we have created designated Provider Care Teams.  These Care Teams include your primary Cardiologist (physician) and Advanced Practice Providers (APPs -  Physician Assistants and Nurse Practitioners) who all work together to provide you with the care you need, when you need it.   Your next appointment:   12 month(s)  The format for your next appointment:   In Person  Provider:   Werner Lean, MD        Signed, Werner Lean, MD  01/13/2021 5:25 PM    [place]

## 2021-01-13 ENCOUNTER — Encounter: Payer: Self-pay | Admitting: Internal Medicine

## 2021-01-13 ENCOUNTER — Ambulatory Visit: Payer: 59 | Admitting: Internal Medicine

## 2021-01-13 ENCOUNTER — Other Ambulatory Visit: Payer: Self-pay

## 2021-01-13 VITALS — BP 120/60 | HR 68 | Ht 67.5 in | Wt 160.0 lb

## 2021-01-13 DIAGNOSIS — I700 Atherosclerosis of aorta: Secondary | ICD-10-CM | POA: Diagnosis not present

## 2021-01-13 DIAGNOSIS — E782 Mixed hyperlipidemia: Secondary | ICD-10-CM

## 2021-01-13 DIAGNOSIS — I10 Essential (primary) hypertension: Secondary | ICD-10-CM

## 2021-01-13 DIAGNOSIS — Z8249 Family history of ischemic heart disease and other diseases of the circulatory system: Secondary | ICD-10-CM

## 2021-01-13 NOTE — Patient Instructions (Signed)
Medication Instructions:  Your physician recommends that you continue on your current medications as directed. Please refer to the Current Medication list given to you today.  *If you need a refill on your cardiac medications before your next appointment, please call your pharmacy*   Lab Work: NONE If you have labs (blood work) drawn today and your tests are completely normal, you will receive your results only by: [place] (if you have MyChart) OR A paper copy in the mail If you have any lab test that is abnormal or we need to change your treatment, we will call you to review the results.   Testing/Procedures: NONE   Follow-Up: At [facility], you and your health needs are our priority.  As part of our continuing mission to provide you with exceptional heart care, we have created designated Provider Care Teams.  These Care Teams include your primary Cardiologist (physician) and Advanced Practice Providers (APPs -  Physician Assistants and Nurse Practitioners) who all work together to provide you with the care you need, when you need it.   Your next appointment:   12 month(s)  The format for your next appointment:   In Person  Provider:   Werner Lean, MD

## 2021-02-14 ENCOUNTER — Ambulatory Visit: Payer: 59 | Admitting: Internal Medicine

## 2021-03-28 ENCOUNTER — Other Ambulatory Visit: Payer: 59

## 2021-04-07 ENCOUNTER — Other Ambulatory Visit: Payer: 59

## 2021-04-23 ENCOUNTER — Encounter (HOSPITAL_BASED_OUTPATIENT_CLINIC_OR_DEPARTMENT_OTHER): Payer: Self-pay | Admitting: *Deleted

## 2021-07-25 ENCOUNTER — Encounter (HOSPITAL_BASED_OUTPATIENT_CLINIC_OR_DEPARTMENT_OTHER): Payer: Self-pay | Admitting: Obstetrics & Gynecology

## 2021-07-25 ENCOUNTER — Ambulatory Visit (INDEPENDENT_AMBULATORY_CARE_PROVIDER_SITE_OTHER): Payer: 59 | Admitting: Obstetrics & Gynecology

## 2021-07-25 VITALS — BP 137/75 | HR 67 | Ht 68.0 in | Wt 161.8 lb

## 2021-07-25 DIAGNOSIS — B009 Herpesviral infection, unspecified: Secondary | ICD-10-CM

## 2021-07-25 DIAGNOSIS — Z01419 Encounter for gynecological examination (general) (routine) without abnormal findings: Secondary | ICD-10-CM | POA: Diagnosis not present

## 2021-07-25 DIAGNOSIS — E785 Hyperlipidemia, unspecified: Secondary | ICD-10-CM

## 2021-07-25 DIAGNOSIS — Z8249 Family history of ischemic heart disease and other diseases of the circulatory system: Secondary | ICD-10-CM | POA: Diagnosis not present

## 2021-07-25 DIAGNOSIS — I10 Essential (primary) hypertension: Secondary | ICD-10-CM

## 2021-07-25 MED ORDER — ESTRADIOL 1 MG PO TABS
1.0000 mg | ORAL_TABLET | Freq: Every day | ORAL | 4 refills | Status: DC
Start: 2021-07-25 — End: 2022-08-10

## 2021-07-30 ENCOUNTER — Other Ambulatory Visit (HOSPITAL_BASED_OUTPATIENT_CLINIC_OR_DEPARTMENT_OTHER): Payer: 59

## 2021-07-30 DIAGNOSIS — E785 Hyperlipidemia, unspecified: Secondary | ICD-10-CM

## 2021-07-31 LAB — COMPREHENSIVE METABOLIC PANEL
ALT: 13 IU/L (ref 0–32)
AST: 16 IU/L (ref 0–40)
Albumin/Globulin Ratio: 1.6 (ref 1.2–2.2)
Albumin: 4.3 g/dL (ref 3.8–4.9)
Alkaline Phosphatase: 71 IU/L (ref 44–121)
BUN/Creatinine Ratio: 15 (ref 9–23)
BUN: 11 mg/dL (ref 6–24)
Bilirubin Total: 0.3 mg/dL (ref 0.0–1.2)
CO2: 22 mmol/L (ref 20–29)
Calcium: 8.6 mg/dL — ABNORMAL LOW (ref 8.7–10.2)
Chloride: 104 mmol/L (ref 96–106)
Creatinine, Ser: 0.75 mg/dL (ref 0.57–1.00)
Globulin, Total: 2.7 g/dL (ref 1.5–4.5)
Glucose: 99 mg/dL (ref 70–99)
Potassium: 4.6 mmol/L (ref 3.5–5.2)
Sodium: 141 mmol/L (ref 134–144)
Total Protein: 7 g/dL (ref 6.0–8.5)
eGFR: 94 mL/min/{1.73_m2} (ref >59)

## 2021-07-31 LAB — LIPID PANEL
Chol/HDL Ratio: 3.3 ratio (ref 0.0–4.4)
Cholesterol, Total: 179 mg/dL (ref 100–199)
HDL: 54 mg/dL (ref >39)
LDL Chol Calc (NIH): 100 mg/dL — ABNORMAL HIGH (ref 0–99)
Triglycerides: 140 mg/dL (ref 0–149)
VLDL Cholesterol Cal: 25 mg/dL (ref 5–40)

## 2021-09-23 ENCOUNTER — Other Ambulatory Visit: Payer: Self-pay | Admitting: Internal Medicine

## 2021-10-29 ENCOUNTER — Telehealth: Payer: Self-pay | Admitting: Internal Medicine

## 2021-10-29 NOTE — Telephone Encounter (Signed)
Pt requesting provider switch from Dr. Gasper Sells to Dr. Harrell Gave due to location.

## 2021-10-30 NOTE — Telephone Encounter (Signed)
Ok by me

## 2021-12-03 ENCOUNTER — Encounter (HOSPITAL_BASED_OUTPATIENT_CLINIC_OR_DEPARTMENT_OTHER): Payer: Self-pay | Admitting: Cardiology

## 2021-12-03 ENCOUNTER — Ambulatory Visit (HOSPITAL_BASED_OUTPATIENT_CLINIC_OR_DEPARTMENT_OTHER): Payer: 59 | Admitting: Cardiology

## 2021-12-03 VITALS — BP 128/70 | HR 72 | Ht 68.0 in | Wt 176.2 lb

## 2021-12-03 DIAGNOSIS — I700 Atherosclerosis of aorta: Secondary | ICD-10-CM | POA: Diagnosis not present

## 2021-12-03 DIAGNOSIS — I10 Essential (primary) hypertension: Secondary | ICD-10-CM | POA: Diagnosis not present

## 2021-12-03 DIAGNOSIS — E782 Mixed hyperlipidemia: Secondary | ICD-10-CM | POA: Diagnosis not present

## 2021-12-03 DIAGNOSIS — I2510 Atherosclerotic heart disease of native coronary artery without angina pectoris: Secondary | ICD-10-CM

## 2021-12-03 DIAGNOSIS — Z8249 Family history of ischemic heart disease and other diseases of the circulatory system: Secondary | ICD-10-CM

## 2021-12-03 DIAGNOSIS — Z7189 Other specified counseling: Secondary | ICD-10-CM

## 2021-12-03 MED ORDER — ROSUVASTATIN CALCIUM 5 MG PO TABS: ORAL_TABLET | ORAL | 1 refills | Status: DC

## 2021-12-03 MED ORDER — ASPIRIN 81 MG PO TBEC: 81.0000 mg | DELAYED_RELEASE_TABLET | Freq: Every day | ORAL | 3 refills | Status: AC

## 2021-12-03 NOTE — Patient Instructions (Addendum)
Medication Instructions:  START: Aspirin 81 mg daily  *If you need a refill on your cardiac medications before your next appointment, please call your pharmacy*   Lab Work: Your provider has recommended lab work today LPa and a Lipid panel in 5 to 6 months before your next appt. Please have this collected at [facility] at [place]. The lab is open 8:00 am - 4:30 pm. Please avoid 12:00p - 1:00p for lunch hour. You do not need an appointment. Please go to [address] [place],  [address]. This is in the Primary Care office on the 3rd floor, let them know you are there for blood work and they will direct you to the lab.  If you have labs (blood work) drawn today and your tests are completely normal, you will receive your results only by: [place] (if you have MyChart) OR A paper copy in the mail If you have any lab test that is abnormal or we need to change your treatment, we will call you to review the results.   Testing/Procedures: None   Follow-Up: At [facility], you and your health needs are our priority.  As part of our continuing mission to provide you with exceptional heart care, we have created designated Provider Care Teams.  These Care Teams include your primary Cardiologist (physician) and Advanced Practice Providers (APPs -  Physician Assistants and Nurse Practitioners) who all work together to provide you with the care you need, when you need it.  We recommend signing up for the patient portal called "MyChart".  Sign up information is provided on this After Visit Summary.  MyChart is used to connect with patients for Virtual Visits (Telemedicine).  Patients are able to view lab/test results, encounter notes, upcoming appointments, etc.  Non-urgent messages can be sent to your provider as well.   To learn more about what you can do with MyChart, go to [email or web address].    Your next appointment:   6 month(s)  The format  for your next appointment:   In Person  Provider:   Buford Dresser, MD    Other Instructions Lipid panel in 5 to 6 months before next appt.    data, summarized from [place] from a study from the [facility]:  https://wilkins.info/  "Researchers at the [facility] set out to answer this question by comparing statins to supplements in a clinical trial. They tracked the outcomes of 190 adults, ages 80 to 60. Some participants were given a 5 mg daily dose of rosuvastatin, a statin that is sold under the brand name Crestor for 28 days. Others were given supplements, including fish oil, cinnamon, garlic, turmeric, plant sterols or red yeast rice for the same period."  "What we found was that rosuvastatin lowered LDL cholesterol by almost 38% and that was vastly superior to placebo and any of the six supplements studied in the trial," study Despina Hidden, M.D. of the [facility], [place] told NPR. He says this level of reduction is enough to lower the risk of heart attacks and strokes. The findings are published in the Journal of the [employer] of Cardiology.  "Oftentimes these supplements are marketed as 'natural ways' to lower your cholesterol," says Laffin. But he says none of the dietary supplements demonstrated any significant decrease in LDL cholesterol compared with a placebo. LDL cholesterol is considered the 'bad cholesterol' because it can contribute to plaque build-up in the artery walls - which can narrow  the arteries, and set the stage for heart attacks and strokes"

## 2021-12-03 NOTE — Progress Notes (Signed)
Cardiology Office Note:    Date:  12/03/2021   ID:  Holly Hampton, DOB [birth year], MRN [id number]  PCP:  Marda Stalker, PA-C  Cardiologist:  Buford Dresser, MD  Referring MD: Marda Stalker, PA-C   CC: general follow up/establish care with me  History of Present Illness:    Holly Hampton is a 56 y.o. female with a hx of coronary artery disease, heart murmur, hypertension, and hyperlipidemia, who is seen for follow-up and to establish care with me.   Previously followed by Dr. Gasper Sells, last seen by him 01/13/2021 where she was doing well and tolerating rosuvastatin. Her EKG showed SR, rate 67, borderline criteria for anterior infarct.  She had a coronary CT 12/09/2020 revealing a coronary calcium score of 239 (LAD 83, Left circumflex artery 151, RCA 5). She was started on 5 mg rosuvastatin, but requested a baseline fasting lipid panel prior to starting statin therapy. On 12/24/20 this showed LDL 115, HDL 44, triglycerides 153. Repeat lipids 07/2021 showed LDL 100, HDL 54, triglycerides 140.  Cardiovascular risk factors: Hyperlipidemia - tolerating rosuvastatin.   Metabolic syndrome/Obesity: She endorses significant weight gain. Currently 176 lbs in clinic today. Family history: Her sister who is 32 years older had 2 strokes in the past 2 years. Her father died of heart disease in his early 63's. Her father had 3-4 siblings who died of heart attacks in their 22's. Exercise level: Used to run daily, fell out of the habit in the past few years. Resumed her exercise to work on lowering her cholesterol. Now she does walk 2-3 miles per day at least 3-4 days a week. Current diet: She admits to struggling with stress eating. She has a high stress job. 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 She has ordered out twice in the past week.  Sometimes she is aware of palpitations, usually related to stress. They do not occur randomly. Additionally she complains of  ongoing hair loss.   On occasion she will notice swelling in her feet. This usually correlates with increased exercise.  She denies any chest pain, or shortness of breath. No lightheadedness, headaches, syncope, orthopnea, or PND.  Past Medical History:  Diagnosis Date   Abnormal Pap smear    +HPV   Anxiety    Colon polyps    Depression    Heart murmur    asymptomatic   HTN (hypertension)    Infertility, female    PONV (postoperative nausea and vomiting)    Seasonal allergies    STD (sexually transmitted disease)    HSV1    Past Surgical History:  Procedure Laterality Date   COLONOSCOPY  2008, 2014   polyps removed   COLPOSCOPY     CRYOTHERAPY     CYSTOSCOPY N/A 09/23/2015   Procedure: CYSTOSCOPY;  Surgeon: Megan Salon, MD;  Location: [facility];  Service: Gynecology;  Laterality: N/A;   DERMOID CYST  EXCISION  left   6cm   ECTOPIC PREGNANCY SURGERY  2010   right salpingectomy, partial left ovary removal   LAPAROSCOPIC BILATERAL SALPINGECTOMY Bilateral 09/23/2015   Procedure: LAPAROSCOPIC LEFT SALPINGECTOMY;  Surgeon: Megan Salon, MD;  Location: [facility];  Service: Gynecology;  Laterality: Bilateral;   LAPAROSCOPIC HYSTERECTOMY N/A 09/23/2015   Procedure: HYSTERECTOMY TOTAL LAPAROSCOPIC;  Surgeon: Megan Salon, MD;  Location: [facility];  Service: Gynecology;  Laterality: N/A;   MANDIBLE SURGERY  1992    Current Medications: Current Outpatient Medications on File Prior to Visit  Medication Sig   cetirizine (ZYRTEC) 10 MG tablet Take 10 mg by mouth daily.   Cholecalciferol (VITAMIN D PO) Take 1 tablet by mouth daily.    Coenzyme Q10 (COQ10) 50 MG CAPS Take by mouth daily.   estradiol (ESTRACE) 1 MG tablet Take 1 tablet (1 mg total) by mouth daily.   Flaxseed, Linseed, (FLAXSEED OIL) 1400 MG CAPS daily.   lisinopril (ZESTRIL) 20 MG tablet Take 20 mg by mouth daily.   Multiple Vitamins-Minerals (MULTIVITAMIN PO) Take 1 tablet by mouth daily.   Probiotic Product (PROBIOTIC PO)  Take 1 tablet by mouth daily.    rosuvastatin (CRESTOR) 5 MG tablet TAKE 1 TABLET(5 MG) BY MOUTH DAILY   tretinoin (RETIN-A) 0.05 % cream Apply 1 application topically 3 (three) times a week.   valACYclovir (VALTREX) 1000 MG tablet 2 tabs po x 1, repeat in 12 hours   No current facility-administered medications on file prior to visit.     Allergies:   Grass pollen(k-o-r-t-swt vern) and Other   Social History   Tobacco Use   Smoking status: Never   Smokeless tobacco: Never  Vaping Use   Vaping Use: Never used  Substance Use Topics   Alcohol use: Yes    Alcohol/week: 2.0 standard drinks of alcohol    Types: 2 Standard drinks or equivalent per week   Drug use: No    Family History: family history includes CVA in her sister; Colon polyps in her mother; Coronary artery disease in her sister; Heart attack in her father; Heart disease in her father, paternal uncle, paternal uncle, and sister; Hypertension in her maternal grandmother; Osteoporosis in her mother; Stroke in her maternal grandmother. There is no history of Colon cancer, Esophageal cancer, Rectal cancer, or Stomach cancer.  ROS:   Please see the history of present illness.  Additional pertinent ROS: Constitutional: Negative for chills, fever, night sweats, unintentional weight loss  HENT: Negative for ear pain and hearing loss. Positive for thinning hair.  Eyes: Negative for loss of vision and eye pain.  Respiratory: Negative for cough, sputum, wheezing.   Cardiovascular: See HPI. Gastrointestinal: Negative for abdominal pain, melena, and hematochezia.  Genitourinary: Negative for dysuria and hematuria.  Musculoskeletal: Negative for falls and myalgias.  Skin: Negative for itching and rash.  Neurological: Negative for focal weakness, focal sensory changes and loss of consciousness.  Endo/Heme/Allergies: Does not bruise/bleed easily.     EKGs/Labs/Other Studies Reviewed:    The following studies were reviewed  today:  CT Calcium Score  12/09/2020: FINDINGS: Coronary arteries: Normal origins.   Coronary Calcium Score:   Left main: 0   Left anterior descending artery: 83   Left circumflex artery: 151   Right coronary artery: 5   Total: 239   Percentile: 98th   Pericardium: Normal.   Ascending Aorta: Normal caliber.   Non-cardiac: See separate report from [facility].   IMPRESSION: Coronary calcium score of 239. This was 98th percentile for age-, race-, and sex-matched controls.   EKG:  EKG is personally reviewed.   12/03/2021: NSR at 72 bpm, negative deflection V1/V2  Recent Labs: 07/30/2021: ALT 13; BUN 11; Creatinine, Ser 0.75; Potassium 4.6; Sodium 141   Recent Lipid Panel    Component Value Date/Time   CHOL 179 07/30/2021 0923   TRIG 140 07/30/2021 0923   HDL 54 07/30/2021 0923   CHOLHDL 3.3 07/30/2021 0923   CHOLHDL 4.1 05/27/2020 0927   VLDL 25 05/27/2020 0927   LDLCALC 100 (H) 07/30/2021 7425  Physical Exam:    VS:  BP 128/70 (BP Location: Right Arm, Patient Position: Sitting, Cuff Size: Normal)   Pulse 72   Ht '5\' 8"'$  (1.727 m)   Wt 176 lb 3.2 oz (79.9 kg)   LMP 08/31/2015   BMI 26.79 kg/m     Wt Readings from Last 3 Encounters:  12/03/21 176 lb 3.2 oz (79.9 kg)  07/25/21 161 lb 12.8 oz (73.4 kg)  01/13/21 160 lb (72.6 kg)    GEN: Well nourished, well developed in no acute distress HEENT: Normal, moist mucous membranes NECK: No JVD CARDIAC: regular rhythm, normal S1 and S2, no rubs or gallops. No murmur. VASCULAR: Radial and DP pulses 2+ bilaterally. No carotid bruits RESPIRATORY:  Clear to auscultation without rales, wheezing or rhonchi  ABDOMEN: Soft, non-tender, non-distended MUSCULOSKELETAL:  Ambulates independently SKIN: Warm and dry, no edema NEUROLOGIC:  Alert and oriented x 3. No focal neuro deficits noted. PSYCHIATRIC:  Normal affect    ASSESSMENT:    1. Nonocclusive coronary atherosclerosis of native coronary artery   2.  Family history of early CAD   3. Mixed hyperlipidemia   4. Aortic atherosclerosis ([place])   5. Essential hypertension   6. Cardiac risk counseling   7. Counseling on health promotion and disease prevention    PLAN:    Nonobstructive CAD Family history of early CAD Mixed hyperlipidemia Aortic atherosclerosis -discussed guideline recommendations for aspirin and statin. She is tolerating statin. She is amenable to starting aspirin -reviewed red flag warning signs that need immediate medical attention -check lp(a) given family history -LDL goal <70, recheck lipids prior to next appt.  Hypertension -at goal, continue lisinopril  Cardiac risk counseling and prevention recommendations: -recommend heart healthy/Mediterranean diet, with whole grains, fruits, vegetable, fish, lean meats, nuts, and olive oil. Limit salt. -recommend moderate walking, 3-5 times/week for 30-50 minutes each session. Aim for at least 150 minutes.week. Goal should be pace of 3 miles/hours, or walking 1.5 miles in 30 minutes -recommend avoidance of tobacco products. Avoid excess alcohol.  Plan for follow up: 6 months or sooner as needed.  Buford Dresser, MD, PhD, [facility]    Medication Adjustments/Labs and Tests Ordered: Current medicines are reviewed at length with the patient today.  Concerns regarding medicines are outlined above.   Orders Placed This Encounter  Procedures   Lipoprotein A (LPA)   Lipid panel   EKG 12-Lead   Meds ordered this encounter  Medications   aspirin EC 81 MG tablet    Sig: Take 1 tablet (81 mg total) by mouth daily. Swallow whole.    Dispense:  90 tablet    Refill:  3   rosuvastatin (CRESTOR) 5 MG tablet    Sig: TAKE 1 TABLET(5 MG) BY MOUTH DAILY    Dispense:  90 tablet    Refill:  1   Patient Instructions  Medication Instructions:  START: Aspirin 81 mg daily  *If you need a refill on your cardiac medications before your next appointment,  please call your pharmacy*   Lab Work: Your provider has recommended lab work today LPa and a Lipid panel in 5 to 6 months before your next appt. Please have this collected at [facility] at [place]. The lab is open 8:00 am - 4:30 pm. Please avoid 12:00p - 1:00p for lunch hour. You do not need an appointment. Please go to [address] [place], [place] [address]. This is in the Primary Care office on the  3rd floor, let them know you are there for blood work and they will direct you to the lab.  If you have labs (blood work) drawn today and your tests are completely normal, you will receive your results only by: [place] (if you have MyChart) OR A paper copy in the mail If you have any lab test that is abnormal or we need to change your treatment, we will call you to review the results.   Testing/Procedures: None   Follow-Up: At [facility], you and your health needs are our priority.  As part of our continuing mission to provide you with exceptional heart care, we have created designated Provider Care Teams.  These Care Teams include your primary Cardiologist (physician) and Advanced Practice Providers (APPs -  Physician Assistants and Nurse Practitioners) who all work together to provide you with the care you need, when you need it.  We recommend signing up for the patient portal called "MyChart".  Sign up information is provided on this After Visit Summary.  MyChart is used to connect with patients for Virtual Visits (Telemedicine).  Patients are able to view lab/test results, encounter notes, upcoming appointments, etc.  Non-urgent messages can be sent to your provider as well.   To learn more about what you can do with MyChart, go to [email or web address].    Your next appointment:   6 month(s)  The format for your next appointment:   In Person  Provider:   Buford Dresser, MD    Other Instructions Lipid panel in 5 to 6 months  before next appt.    data, summarized from [place] from a study from the [facility]:  https://wilkins.info/  "Researchers at the [facility] set out to answer this question by comparing statins to supplements in a clinical trial. They tracked the outcomes of 190 adults, ages 3 to 22. Some participants were given a 5 mg daily dose of rosuvastatin, a statin that is sold under the brand name Crestor for 28 days. Others were given supplements, including fish oil, cinnamon, garlic, turmeric, plant sterols or red yeast rice for the same period."  "What we found was that rosuvastatin lowered LDL cholesterol by almost 38% and that was vastly superior to placebo and any of the six supplements studied in the trial," study Despina Hidden, M.D. of the [facility], [place] told NPR. He says this level of reduction is enough to lower the risk of heart attacks and strokes. The findings are published in the Journal of the [employer] of Cardiology.  "Oftentimes these supplements are marketed as 'natural ways' to lower your cholesterol," says Laffin. But he says none of the dietary supplements demonstrated any significant decrease in LDL cholesterol compared with a placebo. LDL cholesterol is considered the 'bad cholesterol' because it can contribute to plaque build-up in the artery walls - which can narrow the arteries, and set the stage for heart attacks and strokes"    I,Holly Hampton,acting as a scribe for [employer], MD.,have documented all relevant documentation on the behalf of Buford Dresser, MD,as directed by  Buford Dresser, MD while in the presence of Buford Dresser, MD.  I, Buford Dresser, MD, have reviewed all documentation for this visit. The documentation on 01/01/22 for the exam, diagnosis,  procedures, and orders are all accurate and complete.   Signed, Buford Dresser, MD PhD 12/03/2021     [place]

## 2021-12-04 LAB — LIPOPROTEIN A (LPA): Lipoprotein (a): 16.4 nmol/L (ref <75.0)

## 2022-01-01 ENCOUNTER — Encounter (HOSPITAL_BASED_OUTPATIENT_CLINIC_OR_DEPARTMENT_OTHER): Payer: Self-pay | Admitting: Cardiology

## 2022-03-20 ENCOUNTER — Encounter: Payer: Self-pay | Admitting: *Deleted

## 2022-03-20 DIAGNOSIS — Z006 Encounter for examination for normal comparison and control in clinical research program: Secondary | ICD-10-CM

## 2022-03-20 NOTE — Research (Signed)
Message left for Mr Sepich about V1P research. Encouraged her to call back with any questions.

## 2022-05-01 ENCOUNTER — Encounter: Payer: Self-pay | Admitting: *Deleted

## 2022-05-01 DIAGNOSIS — Z006 Encounter for examination for normal comparison and control in clinical research program: Secondary | ICD-10-CM

## 2022-05-01 NOTE — Research (Signed)
Spoke to Holly Hampton about V1P research. She requested more information. Consent emailed to her to review. Encouraged her to call with any questions.

## 2022-05-04 ENCOUNTER — Encounter (HOSPITAL_BASED_OUTPATIENT_CLINIC_OR_DEPARTMENT_OTHER): Payer: Self-pay | Admitting: *Deleted

## 2022-05-28 ENCOUNTER — Encounter: Payer: Self-pay | Admitting: *Deleted

## 2022-05-28 DIAGNOSIS — Z006 Encounter for examination for normal comparison and control in clinical research program: Secondary | ICD-10-CM

## 2022-05-28 NOTE — Research (Signed)
Message left for Holly Hampton about V1P encouraged her to call with any questions, or if she is interested in coming in for screening.

## 2022-05-29 ENCOUNTER — Encounter: Payer: Self-pay | Admitting: *Deleted

## 2022-05-29 DIAGNOSIS — Z006 Encounter for examination for normal comparison and control in clinical research program: Secondary | ICD-10-CM

## 2022-05-29 NOTE — Research (Signed)
Spoke with Holly Hampton about V1P scheduled her for May 14 at 0800

## 2022-06-16 ENCOUNTER — Encounter: Payer: 59 | Admitting: *Deleted

## 2022-06-16 ENCOUNTER — Other Ambulatory Visit: Payer: Self-pay

## 2022-06-16 VITALS — BP 114/70 | HR 70 | Resp 16

## 2022-06-16 DIAGNOSIS — Z006 Encounter for examination for normal comparison and control in clinical research program: Secondary | ICD-10-CM

## 2022-06-16 NOTE — Research (Addendum)
V1P Informed Consent  Protocol No. [id number] ICF Version number: 01.01.04     Subject met inclusion and exclusion criteria.  The informed consent form, study requirements and expectations were reviewed with the subject and questions and concerns were addressed prior to the signing of the consent form.  The subject verbalized understanding of the trial requirements.  The subject agreed to participate in the Victorion 1 Prevent trial and signed the informed consent on 16-Jun-2022 at 0800.  Informed consent was obtained prior to performance of any protocol-specific procedures. A copy of the signed informed consent was given to the subject and original copy was placed in the subject's medical record.   Subject [id number]    Date:16-Jun-2022 Female []    Female [x]  Age:57 Ethnicity: Hispanic/Latino []   Non-Hispanic/Latino [x]  Race: White [x]  Black or African American []  Asian []  American [place] or [place] Native []  Native Hawaiian or Other Pacific Islander []  Other []   Inclusion Criteria Informed Consent Signed  [x]   Female 47-26 years of age Female 77-62 years of age []  [x]   At increased risk for first MACE by one of the following:   a.) Coronary artery calcium (CAC) score ?100 Agatstin units [x]   b.) Coronary artery stenosis ?20% but <50% in left main coronary artery or ? 20% but <70% in any major epicardial coronary artery []   c.) High 10-year ASCVD risk ? 20% []   d.) Intermediate 10-year ASCVD risk 7.5% - <20% with at least 2 of the following risk-enhancing factors []   1. A first degree relative with history of premature ASCVD (males age <5;  females age <65) []  []   2. History of premature menopause before age 75 []   3. History of preeclampsia, gestational hypertension, or gestational diabetes []   4. [place] Asian ancestry []   5. Chronic inflammatory disorders: -rheumatoid arthritis -systemic lupus erythematous -psoriasis covering ? 10% of body surface area -Chron's  disease -ulcerative colitis -HIV/AIDS []  []  []  []  []  []   6. hsCRP ? 2mg /L documented in the 12 months prior to screening visit without underlying inflammatory conditions []   7. Lipoprotein (a) ? 50 mg/dL or 562 nmol/L []   8. eGFR <72mL/min/1.73 documented between screening and randomization visits []   9. ABI <0.9 with no symptoms of intermittent claudication []   10. Metabolic syndrome- Increased waist circumference Elevated triglycerides Elevated blood pressure Elevated glucose Low HDL-C (3 or more documented between screening and randomization visits)  []  []  []  []  []   If on background LLT, dose stable for at least 4 weeks prior to screening  []   LDL-C ? 70mg /dL but <130 mg/dL at screening visit [x]    Exclusion Criteria History of Major ASCVD event defined as any: Acute coronary syndrome in past 12 months Prior myocardial infarction Prior ischemic stroke Symptomatic peripheral artery disease as evidenced by either intermittent claudication, previous revascularization, or amputation due to atherosclerotic disease at any time prior to randomization    []     N/A [x]   History of, or planned ischemia-driven revascularization prior to randomization []  N/A [x]   Absence of coronary atherosclerosis on CT angiogram/invasive coronary angiogram in the 2 years prior to randomization []  N/A [x]   Coronary artery calcium score of 0 obtained in the 2 years prior to randomization []  N/A [x]   Active liver disease of hepatic dysfunction, defined as AST or ALT ? 3x ULN from central laboratory test as screening visit []  N/A [x]   Current or planned renal dialysis or transplantation []  N/A [x]   Previous (within 90 days prior to screening), current or  planned treatment with a mAb directed toward PCSK9 []  N/A [x]   Previous exposure to inclisiran or other non-mAb PCSK9 within 2 years prior to randomization []  N/A [x]   Severe concomitant non-cardiovascular disease that is expected to reduce life  expectancy to less than 5 years []  N/A [x]   Use of other investigational drugs within 30 days or until expected pharmacodynamic effected has returned to baseline, whichever is longer []  N/A [x]   History of hypersensitivity to any of the study drugs or its excipients or to drugs of similar chemical class []  N/A [x]   Any surgical or medical condition which may place the participant at higher risk from his/her participation in the study, or is likely to prevent the participant from complying with the requirements of the study of completing the study  []   N/A [x]   Unwillingness or inability to comply with study procedures (including adherence to study visits, fasting blood draws and compliance with study treatment regimens), and medication administration and schedule  []   N/A [x]   Pregnant or nursing women []  N/A [x]   Women of child-bearing potential, unless they are using effective methods of contraception while taking study treatment []  N/A [x]   VP1 Screening Visit   Holly Hampton [birth year] [id number]  Subject number: [id number] Date: 16-Jun-2022 Informed consent signed [x]   ICF signed on 16-Jun-2022 at 0800  Inclusion and exclusion criteria reviewed [x]  Patient demography reviewed [x]  Female []    Female [x]  Age:57 ETHNICITY Hispanic/Latino []    Non-Hispanic/Latino [x]  RACE White [x]   Black or African American []   Asian []  American [place] or [place] Native []   Native Hawaiian or Other Pacific Islander []  Other []   Smoking history reviewed [x]   Never [x]  Former []  Current Every Day []  Current Some Days []  Cigarettes []   Pipe []   Cigars []   e-Cigarettes []     Past and current medical history reviewed [x]  Surgical history reviewed [x]  Prior or concomitant medications reviewed [x]  Lipid lowering medications reviewed [x]   Physical Exam performed by PI or SUB-I [x]   Vital signs taken at :0808, 0811, and 0813 Height:172.72 cm    Weight:169 lbs./ 76.6 kg     Waist circumference: 97   cm   TIME: BP: PULSE:  0808 124/67 63  0811 114/77 67  0813 114/70 70  Mean BP: 117/71    Mean Pulse: 66  Labs collected at: 0843   include: Serum pregnancy []  Fasting Lipid Profile [x]  Broad Clinical Chemistry [x]  Hematology panel [x]  Urinalysis [x]   Genetic Consent obtained [x]   Physical completed by Dr Riley Kill .Patient seen in clinic for V-1 Prevent study visit 16-Jun-2022 Patient denies abdominal pain, nausea/vomiting. Denies signs/symptoms of pancreatitis. Denies other pain. Vital signs obtained  while patient was in seated position.  Past and current medical history reviewed. Surgical history reviewed. Medications reviewed with patient during visit.Ms Tudor reports her sister just passed away with a stroke last week.  Her father died at age 73 from MI.    Current Outpatient Medications:    aspirin EC 81 MG tablet, Take 1 tablet (81 mg total) by mouth daily. Swallow whole., Disp: 90 tablet, Rfl: 3   cetirizine (ZYRTEC) 10 MG tablet, Take 10 mg by mouth daily., Disp: , Rfl:    Cholecalciferol (VITAMIN D PO), Take 1 tablet by mouth daily. , Disp: , Rfl:    Coenzyme Q10 (COQ10) 50 MG CAPS, Take by mouth daily., Disp: , Rfl:    estradiol (ESTRACE) 1 MG tablet, Take 1 tablet (1 mg total) by mouth daily.,  Disp: 90 tablet, Rfl: 4   Flaxseed, Linseed, (FLAXSEED OIL) 1400 MG CAPS, daily., Disp: , Rfl:    lisinopril (ZESTRIL) 20 MG tablet, Take 20 mg by mouth daily., Disp: , Rfl:    Multiple Vitamins-Minerals (MULTIVITAMIN PO), Take 1 tablet by mouth daily., Disp: , Rfl:    Probiotic Product (PROBIOTIC PO), Take 1 tablet by mouth daily. , Disp: , Rfl:    rosuvastatin (CRESTOR) 5 MG tablet, TAKE 1 TABLET(5 MG) BY MOUTH DAILY, Disp: 90 tablet, Rfl: 1   tretinoin (RETIN-A) 0.05 % cream, Apply 1 application topically 3 (three) times a week., Disp: , Rfl:    valACYclovir (VALTREX) 1000 MG tablet, 2 tabs po x 1, repeat in 12 hours, Disp: 30 tablet, Rfl: 1

## 2022-06-18 NOTE — Research (Addendum)
Are there any labs that are clinically significant?  Yes []  OR No[x]   Is the patient eligible to continue enrollment in the study after screening visit?  Yes [x]   OR No[]   ACCESSION NO. [id number]                                             Page 1 of 1                        [id number]                   INVESTIGATOR: ([id number])                          PROTOCOL   [id number]                     Shawnie Pons M.D.                            INVESTIGATOR NO.: [id number]                     c/o Blair Promise                           PATIENT NUMBER: [id number]                     [facility]                  SUBJECT INITIALS NOT COLLECTED:                     [address]                          VISIT: SCRN                     [place],  [place] [address] Screening                   SPONSOR REPORT TO:                 COLLECTION TIME:08:43 DATE:16-Jun-2022                     Catrin Deck                      DATE RECEIVED IN LABORATORY: 17-Jun-2022                     c/o Theodosia Blender              DATE REPORTED BY LABORATORY: 17-Jun-2022                     [employer]                         SEX: F  BIRTHDATE:  02-Feb-1965    AGE: 57y                     [facility]  [address], [place] [address]                                                                                        Ref. Ranges               Clinical    Comments                                                                          Significance                                                                            Yes*  No                    HBA1C                      Fast HbA1c     5.3          <6.5%                      EGFR BY MDRD, ENZYMATIC                      GFR MDRD       71           mL/min/1.24m2                                                            No Ref Rng             CHEMISTRY PANEL                      Total Bili     0.2          0.2-1.2  mg/dL                                Alk Phos       84           35-104 U/L                                   ALT (SGPT)     15  4-43 U/L                                    AST (SGOT)     15           8-40 U/L                                    CK             76           26-192 U/L                                   Sodium         139          132-147 mEq/L                                Potassium      4.4          3.5-5.2 mEq/L                                Chloride       104          94-112 mEq/L                               HEMATOLOGY&DIFFERENTIAL PANEL                      HGB            12.3         11.6-16.4 g/dL                              HCT            39           34-48 %                                      RBC            4.1          4.1-5.6 x106/uL                              MCH            30           26-34 pg                                    MCHC           32           31-38 g/dL  MCV            94           79-98 fL                                    WBC            4.96         3.80-10.70 x103/uL                           Neutrophil     57.2         40.5-75.0 %                                 Lymphocyte     34.7         15.4-48.5%                                   Monocytes      3.7          2.6-10.1 %                                   Eosinophil     3.5          0.0-6.8 %                                    Basophils      0.8          0.0-2.0 %                                    Platelets      287          140-400 x103/uL      URINE MACRO PANEL                      Spec Grav      1.028        1.003-1.035                                 pH             5.0          5.0-8.0                                      Protein        Negative      Ref NWG:NFAOZHYQ                           Glucose        Normal       Ref MVH:QIONGE  Ketones        Negative      Ref HYQ:MVHQIONG                            Bilirubin      Negative      Ref EXB:MWUXLKGM                           Urobilin       Normal       Ref WNU:UVOZDG                              Blood          Negative      Ref Rng:                                                                Negative-Trace                              Nitrite        Negative      Ref UYQ:IHKVQQVZ                           Leuk Est       Negative      Ref DGL:OVFIEPPI                         LIPID PANEL                      HDLC4          53           40-59 mg/dL                                 Triglycer      208          52-262 mg/dL                                 Cholest        189          171-291 mg/dL                                LDL Calc       94           89-210 mg/dL                                 VLDL-C         42      H    7-34 mg/dL  NON HDL CHOLESTEROL                      Non HDL ch     136          mg/dL No Ref Rng                           CREATININE                      Enz Creat      0.83         0.59-1.04 mg/dL     FASTING PLASMA GLUCOSE                      Gluc. Fast     102     H    70-100 mg/dL               IS SUBJECT FASTING?                      Fasting?       Yes                                                    IS SUBJECT OF BLACK RACE?                      black?         No         FEMALE OF CHILDBEARING POT?                      Child pot?     No

## 2022-06-18 NOTE — Progress Notes (Signed)
This very nice patient is followed by Dr. Cristal Deer, and is seen today for pre-screening for Victorian-1 Prevent.  She has an elevated calcium score (239) and hypertension and hyperlipidemia.  She has no cardiac symptoms, and no prior known major CV events.  Her current statin dose is 5mg  daily, and prior LDL was 100mg /dL No current chest pain.  There is a strong family hx of CAD with multiple major CV events.  She does exercise fairly regularly, and notes being a stress eater.  Alert, oriented WF in NAD BP 114/70 P 70 R 16  SPO2 100%  No JVD Lungs clear Cor -  I do not appreciate a murmur.   Ext - no definite edema  Patient has elevated calcium score, LDL 100, and positive family history.  Lp(a) is normal.  I have reviewed the objectives of Victorian 1 Prevent, the nature of randomization, the specific agent and what is known about the approved test agent (inclisiran).  We will provided her some literature on the agent.  Labs will be obtained for pre-screening.   Shiah Berhow. Cheri Kearns, MD, [facility] Program Chair, [facility]

## 2022-06-19 ENCOUNTER — Encounter: Payer: Self-pay | Admitting: *Deleted

## 2022-06-19 DIAGNOSIS — Z006 Encounter for examination for normal comparison and control in clinical research program: Secondary | ICD-10-CM

## 2022-06-19 NOTE — Research (Signed)
Spoke with Holly Hampton to schedule her next V1P visit. Scheduled her for May 21 at 0730.

## 2022-06-23 ENCOUNTER — Encounter: Payer: 59 | Admitting: *Deleted

## 2022-06-23 ENCOUNTER — Other Ambulatory Visit: Payer: Self-pay

## 2022-06-23 VITALS — BP 125/67 | HR 63 | Resp 16

## 2022-06-23 DIAGNOSIS — Z006 Encounter for examination for normal comparison and control in clinical research program: Secondary | ICD-10-CM

## 2022-06-23 MED ORDER — STUDY - VICTORION-1 PREVENT - INCLISIRAN 300 MG/1.5 ML OR PLACEBO SQ INJECTION (PI-STUCKEY)
300.0000 mg | INJECTION | SUBCUTANEOUS | Status: DC
  Administered 2022-06-23: 300 mg via SUBCUTANEOUS
  Filled 2022-06-23: qty 1.5

## 2022-06-23 NOTE — Research (Signed)
Victorion 1 Prevent Baseline Visit Verne Seccombe DOB 03-14-1965 MRN [id number]  Date: 23-Jun-2022  Subject ID: [id number] Kit #: [id number]  Inclusion and exclusion criteria reviewed [x]   Prior or concomitant medications reviewed [x]   Lipid lowering medications reviewed [x]   Surgical history reviewed [x]    TIME: BP: PULSE:  0733 126/66 65  0736 117/67 63  0741 125/67 66   Mean BP:122/66    Mean Pulse: 64 Weight: 169 bs./ 76.6 kg  Lifestyle instructions reviewed [x]   Labs collected at:0758   Fasting Lipid Profile [x]  Fasting Lp(a) [x]  Liver function tests [x]  hsCRP [x]  Genetic testing [x]  Exploratory biomarkers []   AE/SAE assessment completed [x]   Randomization completed [x]  Study drug administered [x]   V1P Study Drug Administration  Subject ID #:  [id number] Kit #: [id number] Administration Site: left lower abd    Patient seen in clinic for V-1 Prevent study visit Baseline V1. Patient denies abdominal pain, nausea/vomiting. Denies any ED or urgent care visits since last seen in research clinic. Denies signs/symptoms of pancreatitis. Denies other pain. Vital signs obtained at 0733, 0736, 0741 while patient was in seated position. Mean seated BP 122/66. Mean seated pulse rate 64. Labs drawn at 0744. Past and current medical history reviewed. Surgical history reviewed. Medications reviewed with patient during visit, denies changes.

## 2022-07-06 NOTE — Research (Addendum)
Are there any labs that are clinically significant?  Yes []  OR No[x]    ACCESSION NO. [id number]                                             Page 1 of 1                        [id number]                   INVESTIGATOR: ([id number])                          PROTOCOL   [id number]                     Shawnie Pons M.D.                            INVESTIGATOR NO.: [id number]                     c/o Blair Promise                           PATIENT NUMBER: [id number]                     [facility]                  SUBJECT INITIALS NOT COLLECTED:                     [address]                          VISIT: V1                     [place],  [place] [address] Baseline V1                   SPONSOR REPORT TO:                 COLLECTION TIME:07:44 DATE:23-Jun-2022                     Catrin Deck                      DATE RECEIVED IN LABORATORY: 24-Jun-2022                     c/o Theodosia Blender              DATE REPORTED BY LABORATORY: 24-Jun-2022                     [employer]                         SEX: F  BIRTHDATE:  02-Feb-1965    AGE: 57y                     [facility] [address], [place]  [address]                                                                                        Ref. Ranges               Clinical    Comments                                                                          Significance                                                                            Yes*  No              IS SUBJECT FASTING?                      Fasting?       Yes                                    HS-CRP                      CRPHS          0.24         0.00-0.50 mg/dL             LIPID PANEL                      HDLC4          54           40-59 mg/dL                                 Triglycer      140          52-262 mg/dL                                 Cholest        177          171-291 mg/dL                                 LDL Calc       95           89-210 mg/dL  VLDL-C         28           7-34 mg/dL                                 NON HDL CHOLESTEROL                      Non HDL ch     123          mg/dL No Ref Rng

## 2022-08-10 ENCOUNTER — Other Ambulatory Visit (HOSPITAL_BASED_OUTPATIENT_CLINIC_OR_DEPARTMENT_OTHER): Payer: Self-pay | Admitting: Obstetrics & Gynecology

## 2022-08-10 NOTE — Telephone Encounter (Signed)
Pt is scheduled this month for her annual with Dr Hyacinth Meeker

## 2022-08-17 ENCOUNTER — Ambulatory Visit (HOSPITAL_BASED_OUTPATIENT_CLINIC_OR_DEPARTMENT_OTHER): Payer: 59 | Admitting: Obstetrics & Gynecology

## 2022-08-25 ENCOUNTER — Ambulatory Visit (HOSPITAL_BASED_OUTPATIENT_CLINIC_OR_DEPARTMENT_OTHER): Payer: 59 | Admitting: Obstetrics & Gynecology

## 2022-08-25 ENCOUNTER — Encounter (HOSPITAL_BASED_OUTPATIENT_CLINIC_OR_DEPARTMENT_OTHER): Payer: Self-pay | Admitting: Obstetrics & Gynecology

## 2022-08-25 VITALS — BP 115/71 | HR 67 | Ht 67.5 in | Wt 168.0 lb

## 2022-08-25 DIAGNOSIS — Z90710 Acquired absence of both cervix and uterus: Secondary | ICD-10-CM

## 2022-08-25 DIAGNOSIS — B009 Herpesviral infection, unspecified: Secondary | ICD-10-CM

## 2022-08-25 DIAGNOSIS — E785 Hyperlipidemia, unspecified: Secondary | ICD-10-CM | POA: Diagnosis not present

## 2022-08-25 DIAGNOSIS — Z79890 Hormone replacement therapy (postmenopausal): Secondary | ICD-10-CM

## 2022-08-25 DIAGNOSIS — Z01419 Encounter for gynecological examination (general) (routine) without abnormal findings: Secondary | ICD-10-CM | POA: Diagnosis not present

## 2022-08-25 MED ORDER — ESTRADIOL 1 MG PO TABS
1.0000 mg | ORAL_TABLET | Freq: Every day | ORAL | 3 refills | Status: DC
Start: 2022-08-25 — End: 2023-09-15

## 2022-08-25 MED ORDER — VALACYCLOVIR HCL 1 G PO TABS
ORAL_TABLET | ORAL | 1 refills | Status: DC
Start: 2022-08-25 — End: 2023-11-24

## 2022-08-25 NOTE — Progress Notes (Signed)
57 y.o. G1P0010 Legally Separated White or Caucasian female here for annual exam.  Denies vaginal bleeding.  Having a lot of stressors.    Sister passed away earlier this year.  Was found in her apartment.  She'd just moved here.    About six weeks ago, she got her heel caught on a curb and she fell and had a ruptured disk.  She's had a lot of issues sleeping.  She is on gabapentin and that has really helped.  She will start PT tomorrow morning.    Denies vaginal bleeding.   She is having some urinary leakage that is most present right after she voids.  Pelvic PT discussed.    Patient's last menstrual period was 08/31/2015.          Sexually active: Yes.    The current method of family planning is status post hysterectomy.    Smoker:  no  Health Maintenance: Pap:  not indicated History of abnormal Pap:  no MMG:  04/2022 Colonoscopy:  2019, follow up 10 years BMD:   not indicated Screening Labs: ordered today   reports that she has never smoked. She has never used smokeless tobacco. She reports current alcohol use of about 2.0 standard drinks of alcohol per week. She reports that she does not use drugs.  Past Medical History:  Diagnosis Date   Abnormal Pap smear    +HPV   Anxiety    Colon polyps    Depression    Heart murmur    asymptomatic   HTN (hypertension)    Infertility, female    PONV (postoperative nausea and vomiting)    Seasonal allergies 08/19/2012   STD (sexually transmitted disease)    HSV1    Past Surgical History:  Procedure Laterality Date   COLONOSCOPY  2008, 2014   polyps removed   COLPOSCOPY     CRYOTHERAPY     CYSTOSCOPY N/A 09/23/2015   Procedure: CYSTOSCOPY;  Surgeon: Jerene Bears, MD;  Location: [facility];  Service: Gynecology;  Laterality: N/A;   DERMOID CYST  EXCISION  left   6cm   ECTOPIC PREGNANCY SURGERY  2010   right salpingectomy, partial left ovary removal   LAPAROSCOPIC BILATERAL SALPINGECTOMY Bilateral 09/23/2015   Procedure:  LAPAROSCOPIC LEFT SALPINGECTOMY;  Surgeon: Jerene Bears, MD;  Location: [facility];  Service: Gynecology;  Laterality: Bilateral;   LAPAROSCOPIC HYSTERECTOMY N/A 09/23/2015   Procedure: HYSTERECTOMY TOTAL LAPAROSCOPIC;  Surgeon: Jerene Bears, MD;  Location: [facility];  Service: Gynecology;  Laterality: N/A;   MANDIBLE SURGERY  1992    Current Outpatient Medications  Medication Sig Dispense Refill   aspirin EC 81 MG tablet Take 1 tablet (81 mg total) by mouth daily. Swallow whole. 90 tablet 3   cetirizine (ZYRTEC) 10 MG tablet Take 10 mg by mouth daily.     Cholecalciferol (VITAMIN D PO) Take 1 tablet by mouth daily.      Coenzyme Q10 (COQ10) 50 MG CAPS Take by mouth daily.     estradiol (ESTRACE) 1 MG tablet TAKE 1 TABLET(1 MG) BY MOUTH DAILY 90 tablet 0   Flaxseed, Linseed, (FLAXSEED OIL) 1400 MG CAPS daily.     lisinopril (ZESTRIL) 20 MG tablet Take 20 mg by mouth daily.     Multiple Vitamins-Minerals (MULTIVITAMIN PO) Take 1 tablet by mouth daily.     Probiotic Product (PROBIOTIC PO) Take 1 tablet by mouth daily.      rosuvastatin (CRESTOR) 5 MG tablet TAKE 1 TABLET(5 MG)  BY MOUTH DAILY 90 tablet 1   tretinoin (RETIN-A) 0.05 % cream Apply 1 application topically 3 (three) times a week.     valACYclovir (VALTREX) 1000 MG tablet 2 tabs po x 1, repeat in 12 hours 30 tablet 1   Current Facility-Administered Medications  Medication Dose Route Frequency Provider Last Rate Last Admin   Study - VICTORION-1 PREVENT - inclisiran 300 mg/1.73mL or placebo SQ injection (PI-Stuckey)  300 mg Subcutaneous Q6 months Herby Abraham, MD   300 mg at 06/23/22 0808    Family History  Problem Relation Age of Onset   Colon polyps Mother    Osteoporosis Mother    Heart disease Father    Heart attack Father    Heart disease Sister        heart stent   Coronary artery disease Sister    CVA Sister    Heart disease Paternal Uncle        all had heart disease and passed of MI   Heart disease Paternal Uncle     Stroke Maternal Grandmother    Hypertension Maternal Grandmother    Colon cancer Neg Hx    Esophageal cancer Neg Hx    Rectal cancer Neg Hx    Stomach cancer Neg Hx     ROS: Constitutional: negative Genitourinary:negative  Exam:   BP 115/71 (BP Location: Left Arm, Patient Position: Sitting, Cuff Size: Large)   Pulse 67   Ht 5' 7.5" (1.715 m) Comment: Reported  Wt 168 lb (76.2 kg)   LMP 08/31/2015   BMI 25.92 kg/m   Height: 5' 7.5" (171.5 cm) (Reported)  General appearance: alert, cooperative and appears stated age Head: Normocephalic, without obvious abnormality, atraumatic Neck: no adenopathy, supple, symmetrical, trachea midline and thyroid normal to inspection and palpation Lungs: clear to auscultation bilaterally Breasts: normal appearance, no masses or tenderness Heart: regular rate and rhythm Abdomen: soft, non-tender; bowel sounds normal; no masses,  no organomegaly Extremities: extremities normal, atraumatic, no cyanosis or edema Skin: Skin color, texture, turgor normal. No rashes or lesions Lymph nodes: Cervical, supraclavicular, and axillary nodes normal. No abnormal inguinal nodes palpated Neurologic: Grossly normal   Pelvic: External genitalia:  no lesions              Urethra:  normal appearing urethra with no masses, tenderness or lesions              Bartholins and Skenes: normal                 Vagina: normal appearing vagina with normal color and no discharge, no lesions              Cervix: absent              Pap taken: No. Bimanual Exam:  Uterus:  uterus absent              Adnexa: normal adnexa               Rectovaginal: Confirms               Anus:  normal sphincter tone, no lesions  Chaperone, Ina Homes, CMA, was present for exam.  Assessment/Plan: 1. Well woman exam with routine gynecological exam - Pap smear not indicated - Mammogram 04/2022 - Colonoscopy 2019, follow up 10 years - Bone mineral density not indicated - lab work done  with PCP, Jarrett Soho - vaccines reviewed/updated  2. Elevated lipids - Lipid panel; Future - Hemoglobin A1c;  Future  3. Hormone replacement therapy (HRT) - estradiol (ESTRACE) 1 MG tablet; Take 1 tablet (1 mg total) by mouth daily.  Dispense: 90 tablet; Refill: 3  4. HSV-1 infection - valACYclovir (VALTREX) 1000 MG tablet; 2 tabs po x 1, repeat in 12 hours  Dispense: 30 tablet; Refill: 1  5. H/O: hysterectomy

## 2022-08-26 ENCOUNTER — Encounter (HOSPITAL_BASED_OUTPATIENT_CLINIC_OR_DEPARTMENT_OTHER): Payer: Self-pay | Admitting: Obstetrics & Gynecology

## 2022-08-28 ENCOUNTER — Other Ambulatory Visit (HOSPITAL_BASED_OUTPATIENT_CLINIC_OR_DEPARTMENT_OTHER): Payer: 59

## 2022-08-28 DIAGNOSIS — E785 Hyperlipidemia, unspecified: Secondary | ICD-10-CM

## 2022-08-28 DIAGNOSIS — Z90710 Acquired absence of both cervix and uterus: Secondary | ICD-10-CM | POA: Insufficient documentation

## 2022-09-01 ENCOUNTER — Encounter: Payer: Self-pay | Admitting: *Deleted

## 2022-09-01 DIAGNOSIS — Z006 Encounter for examination for normal comparison and control in clinical research program: Secondary | ICD-10-CM

## 2022-09-01 NOTE — Research (Signed)
Holly Hampton called states that she will be traveling on Aug 19. We rescheduled her for Aug 26 at 0730 for V1p research.

## 2022-09-28 ENCOUNTER — Encounter: Payer: 59 | Admitting: *Deleted

## 2022-09-28 ENCOUNTER — Other Ambulatory Visit: Payer: Self-pay

## 2022-09-28 DIAGNOSIS — Z006 Encounter for examination for normal comparison and control in clinical research program: Secondary | ICD-10-CM

## 2022-09-28 MED ORDER — STUDY - VICTORION-1 PREVENT - INCLISIRAN 300 MG/1.5 ML OR PLACEBO SQ INJECTION (PI-STUCKEY)
300.0000 mg | INJECTION | SUBCUTANEOUS | Status: DC
  Administered 2022-09-28: 300 mg via SUBCUTANEOUS
  Filled 2022-09-28: qty 1.5

## 2022-09-28 NOTE — Research (Signed)
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 Tol well   Ms Debro is here for V2 of V1p. She reports no changes in her meds, no abd pain, or any S&S of pancreatitis, no visits to the Ed or Urgent care since last seen. Scheduled next visit for Feb 14 at 0730.    Current Outpatient Medications:    aspirin EC 81 MG tablet, Take 1 tablet (81 mg total) by mouth daily. Swallow whole., Disp: 90 tablet, Rfl: 3   cetirizine (ZYRTEC) 10 MG tablet, Take 10 mg by mouth daily., Disp: , Rfl:    Cholecalciferol (VITAMIN D PO), Take 1 tablet by mouth daily. , Disp: , Rfl:    Coenzyme Q10 (COQ10) 50 MG CAPS, Take by mouth daily., Disp: , Rfl:    estradiol (ESTRACE) 1 MG tablet, Take 1 tablet (1 mg total) by mouth daily., Disp: 90 tablet, Rfl: 3   Flaxseed, Linseed, (FLAXSEED OIL) 1400 MG CAPS, daily., Disp: , Rfl:    lisinopril (ZESTRIL) 20 MG tablet, Take 20 mg by mouth daily., Disp: , Rfl:    Multiple Vitamins-Minerals (MULTIVITAMIN PO), Take 1 tablet by mouth daily., Disp: , Rfl:    Probiotic Product (PROBIOTIC PO), Take 1 tablet by mouth daily. , Disp: , Rfl:    rosuvastatin (CRESTOR) 5 MG tablet, TAKE 1 TABLET(5 MG) BY MOUTH DAILY, Disp: 90 tablet, Rfl: 1   tretinoin (RETIN-A) 0.05 % cream, Apply 1 application topically 3 (three) times a week., Disp: , Rfl:    valACYclovir (VALTREX) 1000 MG tablet, 2 tabs po x 1, repeat in 12 hours, Disp: 30  tablet, Rfl: 1  Current Facility-Administered Medications:    Study - VICTORION-1 PREVENT - inclisiran 300 mg/1.34mL or placebo SQ injection (PI-Stuckey), 300 mg, Subcutaneous, Q6 months, , 300 mg at 09/28/22 0757

## 2022-12-09 ENCOUNTER — Other Ambulatory Visit: Payer: Self-pay | Admitting: Internal Medicine

## 2022-12-09 DIAGNOSIS — I2510 Atherosclerotic heart disease of native coronary artery without angina pectoris: Secondary | ICD-10-CM

## 2023-02-14 NOTE — Progress Notes (Signed)
 Cardiology Office Note:  .   Date:  02/15/2023  ID:  Holly Hampton, DOB November 04, 1965, MRN [id number] PCP: Katina Pfeiffer, PA-C  [facility] Providers Cardiologist:  Shelda Bruckner, MD {  History of Present Illness: .   Holly Hampton is a 58 y.o. female with PMH nonobstructive CAD, heart murmur, hypertension, family history of heart disease. She established care with me on 12/03/2021.  Pertinent CV history: Ca score 2022 of 239.  Family history: Her sister who is 11 years older had 2 strokes in the past 2 years. Her father died of heart disease in his early 73's. Her father had 3-4 siblings who died of heart attacks in their 106's.   Today: Has a mild cold this AM. On V1P, believes she is on the drug, LDL was 60 in the summer. Since our last visit, her sister was found dead in her apartment (as was her boyfriend). Had been deceased for several days prior to being found. Autopsy report pending.  She had started walking regularly and intentionally lost 10 lbs prior to herniating a disc, and then gained the weight back. Almost back to baseline now. Has a lot of aching in her joints but also had a lot of neuropathic pain with the herniated disc.   Home blood pressure typically well controlled, but taking decongestant today.  ROS: Denies chest pain, shortness of breath at rest or with normal exertion. No PND, orthopnea, LE edema or unexpected weight gain. No syncope or palpitations. ROS otherwise negative except as noted.   Studies Reviewed: SABRA    EKG:  EKG Interpretation Date/Time:  Monday February 15 2023 08:14:07 EST Ventricular Rate:  66 PR Interval:  178 QRS Duration:  80 QT Interval:  382 QTC Calculation: 400 R Axis:   -9  Text Interpretation: Sinus rhythm with Premature ventricular complexes Low voltage QRS Confirmed by Bruckner Shelda [phone]) on 02/15/2023 8:24:01 AM    Physical Exam:   VS:  BP 120/70 Comment: home reading  Pulse 66   Ht 5' 8 (1.727 m)    Wt 170 lb (77.1 kg)   LMP 08/31/2015   SpO2 98%   BMI 25.85 kg/m    Wt Readings from Last 3 Encounters:  02/15/23 170 lb (77.1 kg)  08/25/22 168 lb (76.2 kg)  12/03/21 176 lb 3.2 oz (79.9 kg)    GEN: Well nourished, well developed in no acute distress HEENT: Normal, moist mucous membranes NECK: No JVD CARDIAC: regular rhythm, normal S1 and S2, no rubs or gallops. No murmur. VASCULAR: Radial and DP pulses 2+ bilaterally. No carotid bruits RESPIRATORY:  Clear to auscultation without rales, wheezing or rhonchi  ABDOMEN: Soft, non-tender, non-distended MUSCULOSKELETAL:  Ambulates independently SKIN: Warm and dry, no edema NEUROLOGIC:  Alert and oriented x 3. No focal neuro deficits noted. PSYCHIATRIC:  Normal affect    ASSESSMENT AND PLAN: .    Nonobstructive CAD Family history of early CAD Mixed hyperlipidemia Aortic atherosclerosis -calcium  score 2022 was 239 (98th percentile) -tolerating aspirin  -on statin, tolerates low dose, has some myalgia -reviewed red flag warning signs that need immediate medical attention -normal lp(a) (16) -LDL goal <70, lipids per [facility] 08/2022 Tchol 145, HDL 51, LDL 60, TG 212 (prior to lifestyle change) -discussed getting back to good lifestyle, she plans to work on this -in V1P study, LDL now 60, believes she is probably on inclisiran   Hypertension -at goal by home numbers, continue lisinopril  CV risk counseling and prevention -recommend heart healthy/Mediterranean  diet, with whole grains, fruits, vegetable, fish, lean meats, nuts, and olive oil. Limit salt. -recommend moderate walking, 3-5 times/week for 30-50 minutes each session. Aim for at least 150 minutes.week. Goal should be pace of 3 miles/hours, or walking 1.5 miles in 30 minutes -recommend avoidance of tobacco products. Avoid excess alcohol.  Dispo: 1 year or sooner as needed  Signed, Shelda Bruckner, MD   Shelda Bruckner, MD, PhD, [facility]  [facility]   [place]  [facility] at [facility] at [facility] [address], [address] [place], [place] [address] [phone]

## 2023-02-15 ENCOUNTER — Ambulatory Visit (INDEPENDENT_AMBULATORY_CARE_PROVIDER_SITE_OTHER): Payer: 59 | Admitting: Cardiology

## 2023-02-15 ENCOUNTER — Encounter (HOSPITAL_BASED_OUTPATIENT_CLINIC_OR_DEPARTMENT_OTHER): Payer: Self-pay | Admitting: Cardiology

## 2023-02-15 VITALS — BP 120/70 | HR 66 | Ht 68.0 in | Wt 170.0 lb

## 2023-02-15 DIAGNOSIS — I2510 Atherosclerotic heart disease of native coronary artery without angina pectoris: Secondary | ICD-10-CM

## 2023-02-15 DIAGNOSIS — I10 Essential (primary) hypertension: Secondary | ICD-10-CM | POA: Diagnosis not present

## 2023-02-15 DIAGNOSIS — E782 Mixed hyperlipidemia: Secondary | ICD-10-CM | POA: Diagnosis not present

## 2023-02-15 DIAGNOSIS — Z8249 Family history of ischemic heart disease and other diseases of the circulatory system: Secondary | ICD-10-CM

## 2023-02-15 DIAGNOSIS — I700 Atherosclerosis of aorta: Secondary | ICD-10-CM

## 2023-02-15 DIAGNOSIS — Z7189 Other specified counseling: Secondary | ICD-10-CM

## 2023-02-15 MED ORDER — ROSUVASTATIN CALCIUM 5 MG PO TABS
ORAL_TABLET | ORAL | 3 refills | Status: DC
Start: 2023-02-15 — End: 2023-03-08

## 2023-02-15 NOTE — Patient Instructions (Signed)

## 2023-03-06 ENCOUNTER — Other Ambulatory Visit: Payer: Self-pay | Admitting: Cardiology

## 2023-03-06 DIAGNOSIS — I2510 Atherosclerotic heart disease of native coronary artery without angina pectoris: Secondary | ICD-10-CM

## 2023-03-19 ENCOUNTER — Other Ambulatory Visit: Payer: Self-pay

## 2023-03-19 ENCOUNTER — Encounter: Payer: 59 | Admitting: *Deleted

## 2023-03-19 DIAGNOSIS — Z006 Encounter for examination for normal comparison and control in clinical research program: Secondary | ICD-10-CM

## 2023-03-19 MED ORDER — STUDY - VICTORION-1 PREVENT - INCLISIRAN 300 MG/1.5 ML OR PLACEBO SQ INJECTION (PI-STUCKEY)
300.0000 mg | INJECTION | SUBCUTANEOUS | Status: AC
  Administered 2023-03-19: 300 mg via SUBCUTANEOUS
  Filled 2023-03-19: qty 1.5

## 2023-03-19 NOTE — Research (Signed)
DATE: 19-Mar-2023  SUBJECT ID: [id number]   Visit:3  Prior or concomitant medications reviewed [x]   Lipid lowering medications reviewed [x]   Surgical history reviewed [x]   TIME: BP: PULSE:  0733 125/61 74  0735 117/62 72  0737 121/65 72  Mean BP: 121/62  Mean Pulse: 72  Labs collected at: 0750 Fasting Lipid Profile [x]  Fasting Lp(a) []  Liver function tests []  Exploratory biomarkers []   AE/SAE assessment completed [x]   Study drug administered [x]   Endpoint assessment completed [x]     Holly Hampton is here for visit 3 of V1P. She reports no abd pain, no visits to the Ed or urgent care, and medications reviewed. Injection given in right lower abd tol well Kit number [id number]. Scheduled next visit for Aug 13 at 0730.      Current Outpatient Medications:    aspirin EC 81 MG tablet, Take 1 tablet (81 mg total) by mouth daily. Swallow whole., Disp: 90 tablet, Rfl: 3   cetirizine (ZYRTEC) 10 MG tablet, Take 10 mg by mouth daily., Disp: , Rfl:    Cholecalciferol (VITAMIN D PO), Take 1 tablet by mouth daily. , Disp: , Rfl:    Coenzyme Q10 (COQ10) 50 MG CAPS, Take by mouth daily., Disp: , Rfl:    estradiol (ESTRACE) 1 MG tablet, Take 1 tablet (1 mg total) by mouth daily., Disp: 90 tablet, Rfl: 3   lisinopril (ZESTRIL) 20 MG tablet, Take 20 mg by mouth daily., Disp: , Rfl:    Multiple Vitamins-Minerals (MULTIVITAMIN PO), Take 1 tablet by mouth daily., Disp: , Rfl:    Probiotic Product (PROBIOTIC PO), Take 1 tablet by mouth daily. , Disp: , Rfl:    rosuvastatin (CRESTOR) 5 MG tablet, TAKE 1 TABLET(5 MG) BY MOUTH DAILY, Disp: 90 tablet, Rfl: 3   tretinoin (RETIN-A) 0.05 % cream, Apply 1 application topically 3 (three) times a week., Disp: , Rfl:    valACYclovir (VALTREX) 1000 MG tablet, 2 tabs po x 1, repeat in 12 hours, Disp: 30 tablet, Rfl: 1  Current Facility-Administered Medications:    Study - VICTORION-1 PREVENT - inclisiran 300 mg/1.20mL or placebo SQ injection (PI-Stuckey), 300  mg, Subcutaneous, Q6 months, , 300 mg at 03/19/23 0810

## 2023-05-10 ENCOUNTER — Encounter (HOSPITAL_BASED_OUTPATIENT_CLINIC_OR_DEPARTMENT_OTHER): Payer: Self-pay | Admitting: *Deleted

## 2023-09-15 ENCOUNTER — Telehealth (HOSPITAL_BASED_OUTPATIENT_CLINIC_OR_DEPARTMENT_OTHER): Payer: Self-pay

## 2023-09-15 ENCOUNTER — Encounter: Payer: 59 | Admitting: *Deleted

## 2023-09-15 DIAGNOSIS — Z79890 Hormone replacement therapy (postmenopausal): Secondary | ICD-10-CM

## 2023-09-15 DIAGNOSIS — Z006 Encounter for examination for normal comparison and control in clinical research program: Secondary | ICD-10-CM

## 2023-09-15 MED ORDER — STUDY - VICTORION-1 PREVENT - INCLISIRAN 300 MG/1.5 ML OR PLACEBO SQ INJECTION (PI-STUCKEY)
300.0000 mg | INJECTION | SUBCUTANEOUS | Status: AC
Start: 2023-09-15 — End: ?
  Administered 2023-09-15 (×2): 300 mg via SUBCUTANEOUS
  Filled 2023-09-15: qty 1.5

## 2023-09-15 MED ORDER — ESTRADIOL 1 MG PO TABS: 1.0000 mg | ORAL_TABLET | Freq: Every day | ORAL | 0 refills | Status: DC

## 2023-09-15 NOTE — Telephone Encounter (Signed)
 Patient left a voicemail on the nurse line at 10:19 am. Requesting a refill on Estradiol  1 mg tablet by mouth daily to get her until her next aex with Dr.Miller on 11/24/2023. Last MMG 05/05/23 Birads 1 negative.  Refills for Estradiol  1 mg take 1 tablet by mouth daily #90 0RF sent to pharmacy on file [facility] [phone] [place] at the [facility] and [employer].  Spoke with patient. Advised refill has been sent in to pharmacy. Patient is agreeable.

## 2023-09-15 NOTE — Research (Addendum)
        DATE: 15-Sep-2023  SUBJECT ID: [id number]  Visit: V4  Prior or concomitant medications reviewed [x]   Lipid lowering medications reviewed [x]   Surgical history reviewed [x]   TIME: BP: PULSE:  0733 123/63 77  0736 111/80 69  0740 120/76 70  Mean BP: 118/73  Mean Pulse: 72  Labs collected at: 0749 Fasting Lipid Profile [x]  Fasting Lp(a) [x]  Liver function tests []  Exploratory biomarkers []   AE/SAE assessment completed [x]   Study drug administered [x]   Endpoint assessment completed [x]   Ms Estabrooks is here for V4 of V1P. She states no changes in her meds, no visits to the Ed or urgent care, and no abd pain since last seen. Scheduled next visit for Feb 9th 2026 at 0730.Injection given in right lower abd at 0805. Tol well Kit number [id number].    Current Outpatient Medications:    aspirin  EC 81 MG tablet, Take 1 tablet (81 mg total) by mouth daily. Swallow whole., Disp: 90 tablet, Rfl: 3   cetirizine (ZYRTEC) 10 MG tablet, Take 10 mg by mouth daily., Disp: , Rfl:    Cholecalciferol (VITAMIN D  PO), Take 1 tablet by mouth daily. , Disp: , Rfl:    Coenzyme Q10 (COQ10) 50 MG CAPS, Take by mouth daily., Disp: , Rfl:    estradiol  (ESTRACE ) 1 MG tablet, Take 1 tablet (1 mg total) by mouth daily., Disp: 90 tablet, Rfl: 3   lisinopril (ZESTRIL) 20 MG tablet, Take 20 mg by mouth daily., Disp: , Rfl:    Multiple Vitamins-Minerals (MULTIVITAMIN PO), Take 1 tablet by mouth daily., Disp: , Rfl:    Probiotic Product (PROBIOTIC PO), Take 1 tablet by mouth daily. , Disp: , Rfl:    rosuvastatin  (CRESTOR ) 5 MG tablet, TAKE 1 TABLET(5 MG) BY MOUTH DAILY, Disp: 90 tablet, Rfl: 3   tretinoin (RETIN-A) 0.05 % cream, Apply 1 application topically 3 (three) times a week., Disp: , Rfl:    valACYclovir  (VALTREX ) 1000 MG tablet, 2 tabs po x 1, repeat in 12 hours, Disp: 30 tablet, Rfl: 1  Current Facility-Administered Medications:    Study - VICTORION-1 PREVENT - inclisiran 300 mg/1.5mL or placebo  SQ injection (PI-Stuckey), 300 mg, Subcutaneous, Q6 months, , 300 mg at 03/19/23 9189   Study - VICTORION-1 PREVENT - inclisiran 300 mg/1.5mL or placebo SQ injection (PI-Stuckey), 300 mg, Subcutaneous, Q6 months, , 300 mg at 09/15/23 [phone]

## 2023-11-24 ENCOUNTER — Ambulatory Visit (INDEPENDENT_AMBULATORY_CARE_PROVIDER_SITE_OTHER): Admitting: Obstetrics & Gynecology

## 2023-11-24 ENCOUNTER — Encounter (HOSPITAL_BASED_OUTPATIENT_CLINIC_OR_DEPARTMENT_OTHER): Payer: Self-pay | Admitting: Obstetrics & Gynecology

## 2023-11-24 VITALS — BP 121/82 | HR 74 | Ht 68.0 in | Wt 175.6 lb

## 2023-11-24 DIAGNOSIS — B009 Herpesviral infection, unspecified: Secondary | ICD-10-CM

## 2023-11-24 DIAGNOSIS — N3943 Post-void dribbling: Secondary | ICD-10-CM

## 2023-11-24 DIAGNOSIS — Z8249 Family history of ischemic heart disease and other diseases of the circulatory system: Secondary | ICD-10-CM

## 2023-11-24 DIAGNOSIS — E785 Hyperlipidemia, unspecified: Secondary | ICD-10-CM

## 2023-11-24 DIAGNOSIS — Z01419 Encounter for gynecological examination (general) (routine) without abnormal findings: Secondary | ICD-10-CM

## 2023-11-24 DIAGNOSIS — Z79890 Hormone replacement therapy (postmenopausal): Secondary | ICD-10-CM

## 2023-11-24 DIAGNOSIS — Z90710 Acquired absence of both cervix and uterus: Secondary | ICD-10-CM

## 2023-11-24 DIAGNOSIS — Z23 Encounter for immunization: Secondary | ICD-10-CM

## 2023-11-24 LAB — LIPID PANEL
Chol/HDL Ratio: 1.9 ratio (ref 0.0–4.4)
Cholesterol, Total: 110 mg/dL (ref 100–199)
HDL: 57 mg/dL (ref >39)
LDL Chol Calc (NIH): 33 mg/dL (ref 0–99)
Triglycerides: 107 mg/dL (ref 0–149)
VLDL Cholesterol Cal: 20 mg/dL (ref 5–40)

## 2023-11-24 MED ORDER — ESTRADIOL 0.1 MG/24HR TD PTTW: 1.0000 | MEDICATED_PATCH | TRANSDERMAL | 3 refills | Status: AC

## 2023-11-24 MED ORDER — VALACYCLOVIR HCL 1 G PO TABS: ORAL_TABLET | ORAL | 1 refills | Status: AC

## 2023-11-24 NOTE — Progress Notes (Signed)
 ANNUAL EXAM Patient name: Holly Hampton MRN [id number]  Date of birth: [birth year] Chief Complaint:   Gynecologic Exam (Patient reports that she is having issues with bladder control. She is wondering about starting pelvic PT. /)  History of Present Illness:   Holly Hampton is a 58 y.o. G43P0010 Caucasian female being seen today for a routine annual exam.  Having some issues with urinary incontinence.  She has some urinary leakage after feeling like her bladder is completely empty.  Would like to see PT now.  Referral placed.  Discussed transdermal estrogen therapy vs oral.  Would recommend making this change.  She is open to doing almost anything that will improve her cardiovascular events.  Seeing Dr. Lonni.  On study medication for lipid management.     Patient's last menstrual period was 08/31/2015.  Last pap: Hysterectomy. 05/23/2019. Results were: NILM w/ HRHPV negative. H/O abnormal pap: yes Last mammogram: 05/05/2023. Results were: normal. Family h/o breast cancer: no Last colonoscopy: 01/07/2018. Results were: abnormal 2 polyps. Hyperplastic polyp.  Family h/o colorectal cancer: no.  Report says f/u 10 years but planning f/u 7 years.       08/25/2022    4:08 PM 07/25/2021   11:40 AM 10/30/2015    9:56 AM  Depression screen PHQ 2/9  Decreased Interest 0 0 0  Down, Depressed, Hopeless 0 0 0  PHQ - 2 Score 0 0 0    Review of Systems:   Pertinent items are noted in HPI Denies any pelvic pain.  Urinary issues as per above.  Denies constipation.   Pertinent History Reviewed:  Reviewed past medical,surgical, social and family history.  Reviewed problem list, medications and allergies. Physical Assessment:   Vitals:   11/24/23 0815  BP: 121/82  Pulse: 74  SpO2: 100%  Weight: 175 lb 9.6 oz (79.7 kg)  Height: 5' 8 (1.727 m)  Body mass index is 26.7 kg/m.        Physical Examination:   General appearance - well appearing, and in no distress  Mental status - alert,  oriented to person, place, and time  Psych:  She has a normal mood and affect  Skin - warm and dry, normal color, no suspicious lesions noted  Chest - effort normal, all lung fields clear to auscultation bilaterally  Heart - normal rate and regular rhythm  Neck:  midline trachea, no thyromegaly or nodules  Breasts - breasts appear normal, no suspicious masses, no skin or nipple changes or  axillary nodes  Abdomen - soft, nontender, nondistended, no masses or organomegaly  Pelvic - VULVA: normal appearing vulva with no masses, tenderness or lesions   VAGINA: normal appearing vagina with normal color and discharge, no lesions   CERVIX: surgically absent  Thin prep pap is not indicated today  UTERUS: uterus is felt to be normal size, shape, consistency and nontender   ADNEXA: No adnexal masses or tenderness noted.  Rectal - normal rectal, good sphincter tone, no masses felt.   Extremities:  No swelling or varicosities noted  Chaperone present for exam  No results found for this or any previous visit (from the past 24 hours).  Assessment & Plan:  1. Well woman exam with routine gynecological exam (Primary) - Pap smear not indicated - Mammogram 05/2023 - Colonoscopy 2019.  Repeat 7 years. - Bone mineral density guidelines reviewed - lab work ordered today - vaccines reviewed/updated  2. Post-void dribbling - Ambulatory referral to Physical Therapy  3. H/O: hysterectomy  4. Family history of early CAD - will switch to transdermal estradiol  patch.  Vivelle  dot 0.1mg  patch twice weekly.  #24/3RF  5. HSV-1 infection - valACYclovir  (VALTREX ) 1000 MG tablet; 2 tabs po x 1, repeat in 12 hours  Dispense: 30 tablet; Refill: 1  6. Hormone replacement therapy (HRT)  7. Elevated lipids - Lipid panel  8. Influenza vaccine administered - Flu vaccine trivalent PF, 6mos and older(Flulaval,Afluria,Fluarix,Fluzone)    Orders Placed This Encounter  Procedures   Ambulatory referral to  Physical Therapy    Meds: No orders of the defined types were placed in this encounter.   Follow-up: No follow-ups on file.  Ronal GORMAN Pinal, MD 11/24/2023 8:35 AM

## 2023-11-25 ENCOUNTER — Ambulatory Visit (HOSPITAL_BASED_OUTPATIENT_CLINIC_OR_DEPARTMENT_OTHER): Payer: Self-pay | Admitting: Obstetrics & Gynecology

## 2023-12-15 ENCOUNTER — Other Ambulatory Visit (HOSPITAL_BASED_OUTPATIENT_CLINIC_OR_DEPARTMENT_OTHER): Payer: Self-pay | Admitting: Obstetrics & Gynecology

## 2023-12-15 DIAGNOSIS — Z79890 Hormone replacement therapy (postmenopausal): Secondary | ICD-10-CM

## 2024-01-12 ENCOUNTER — Encounter (HOSPITAL_BASED_OUTPATIENT_CLINIC_OR_DEPARTMENT_OTHER): Payer: Self-pay | Admitting: Cardiology

## 2024-03-09 ENCOUNTER — Other Ambulatory Visit (HOSPITAL_BASED_OUTPATIENT_CLINIC_OR_DEPARTMENT_OTHER): Payer: Self-pay | Admitting: Cardiology

## 2024-03-09 DIAGNOSIS — I2510 Atherosclerotic heart disease of native coronary artery without angina pectoris: Secondary | ICD-10-CM

## 2024-03-13 ENCOUNTER — Encounter
